# Patient Record
Sex: Male | Born: 1955
Health system: Southern US, Community
[De-identification: ages and names within clinical notes are randomized; demographics above are authoritative.]

## PROBLEM LIST (undated history)

## (undated) DIAGNOSIS — M109 Gout, unspecified: Secondary | ICD-10-CM

## (undated) DIAGNOSIS — Z8619 Personal history of other infectious and parasitic diseases: Secondary | ICD-10-CM

## (undated) HISTORY — DX: Personal history of other infectious and parasitic diseases: Z86.19

## (undated) HISTORY — DX: Gout, unspecified: M10.9

---
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
## 2009-01-01 ENCOUNTER — Ambulatory Visit: Payer: Self-pay | Admitting: Gastroenterology

## 2009-01-01 DIAGNOSIS — Z860101 Personal history of adenomatous and serrated colon polyps: Secondary | ICD-10-CM | POA: Insufficient documentation

## 2009-01-01 DIAGNOSIS — Z86010 Personal history of colonic polyps: Secondary | ICD-10-CM | POA: Insufficient documentation

## 2009-01-01 LAB — HM COLONOSCOPY

## 2009-02-04 DIAGNOSIS — M109 Gout, unspecified: Secondary | ICD-10-CM | POA: Insufficient documentation

## 2009-02-24 DIAGNOSIS — I10 Essential (primary) hypertension: Secondary | ICD-10-CM | POA: Insufficient documentation

## 2009-06-03 ENCOUNTER — Ambulatory Visit: Payer: Self-pay | Admitting: Family Medicine

## 2009-06-03 DIAGNOSIS — G4700 Insomnia, unspecified: Secondary | ICD-10-CM | POA: Insufficient documentation

## 2009-06-05 ENCOUNTER — Ambulatory Visit: Payer: Self-pay | Admitting: Family Medicine

## 2009-07-20 ENCOUNTER — Ambulatory Visit: Payer: Self-pay | Admitting: Gastroenterology

## 2010-04-26 ENCOUNTER — Ambulatory Visit: Payer: Self-pay | Admitting: Family Medicine

## 2010-05-03 ENCOUNTER — Ambulatory Visit: Payer: Self-pay | Admitting: Gastroenterology

## 2010-05-03 LAB — HM SIGMOIDOSCOPY

## 2011-01-24 LAB — LIPID PANEL
Cholesterol: 169 mg/dL (ref 0–200)
HDL: 37 mg/dL (ref 35–70)
Triglycerides: 630 mg/dL — AB (ref 40–160)

## 2011-03-15 ENCOUNTER — Emergency Department: Payer: Self-pay | Admitting: Emergency Medicine

## 2011-08-21 IMAGING — CR DG CHEST 2V
1 series · 2 of 2 positions shown · non-contrast
Comparison: none

REASON FOR EXAM: shortness of breath
COMMENTS:

[Series 1: view not recorded · 0.17mm/px · 2 of 2 slices shown]
[im 1/2]
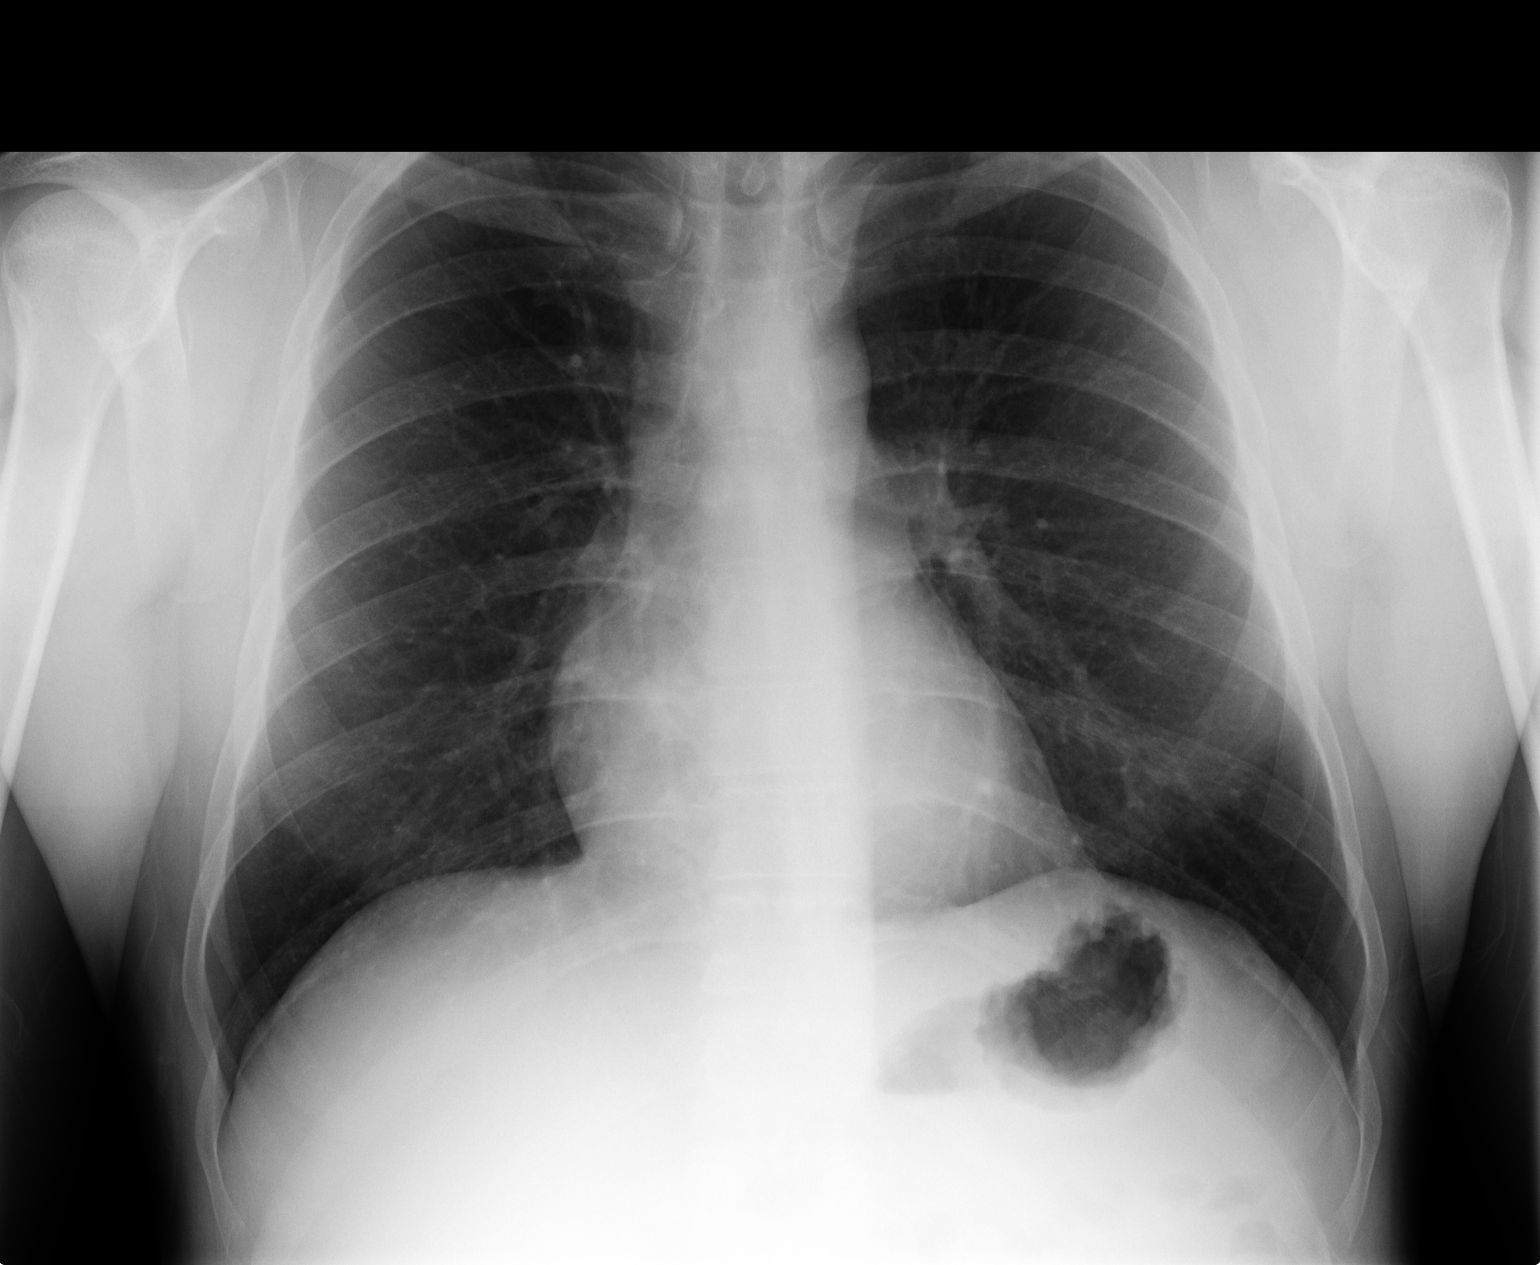
[im 2/2]
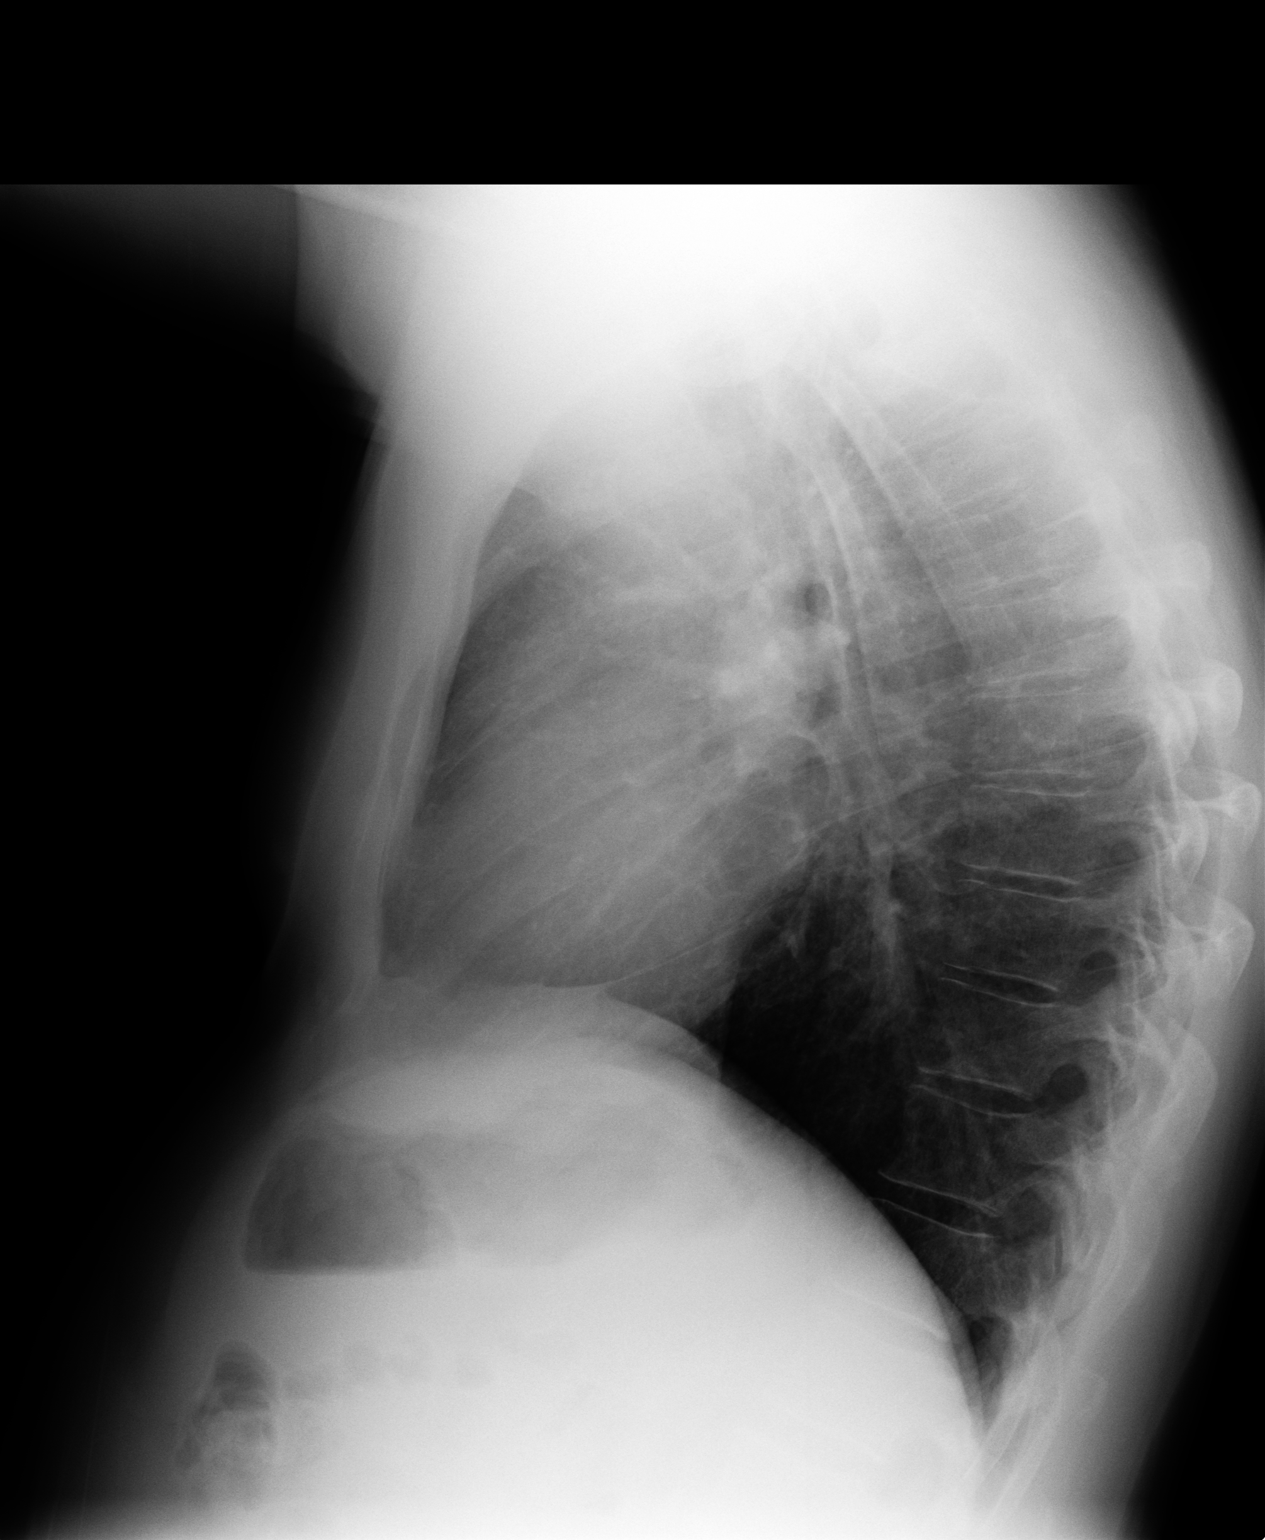

[2 of 2 positions shown; findings below may reference images not displayed]

PROCEDURE:     KDR - KDXR CHEST PA (OR AP) AND LAT  - June 03, 2009  [DATE]

RESULT:     There is no previous exam for comparison.

The lungs are clear. The heart and pulmonary vessels are normal. The bony
and mediastinal structures are unremarkable. There is no effusion. There is
no pneumothorax or evidence of congestive failure.
IMPRESSION: No acute cardiopulmonary disease.

## 2011-08-23 IMAGING — US ABDOMEN ULTRASOUND
1 series · 17 of 25 positions shown · non-contrast
Comparison: none

REASON FOR EXAM: abd pain generalized
COMMENTS:

[Series 1: abdomen ultrasound · 17 of 76 slices shown]
[im 1/76]
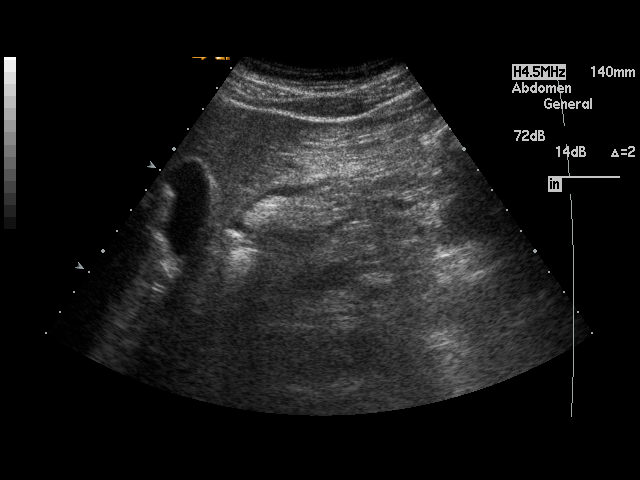
[im 7/76]
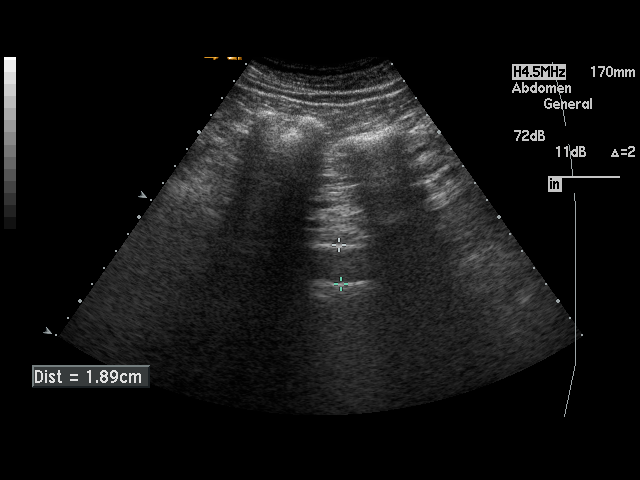
[im 10/76]
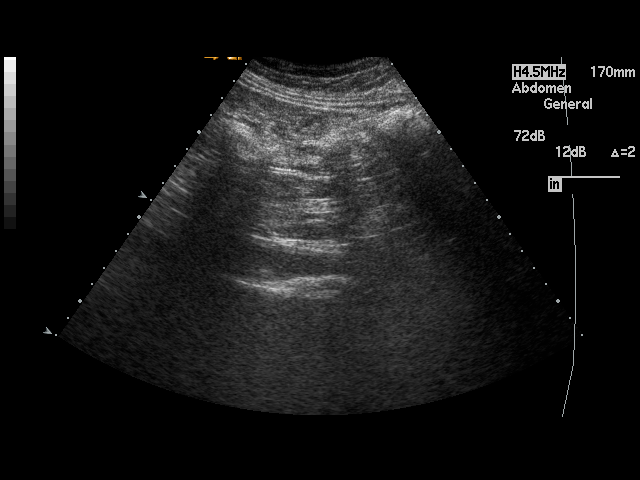
[im 16/76]
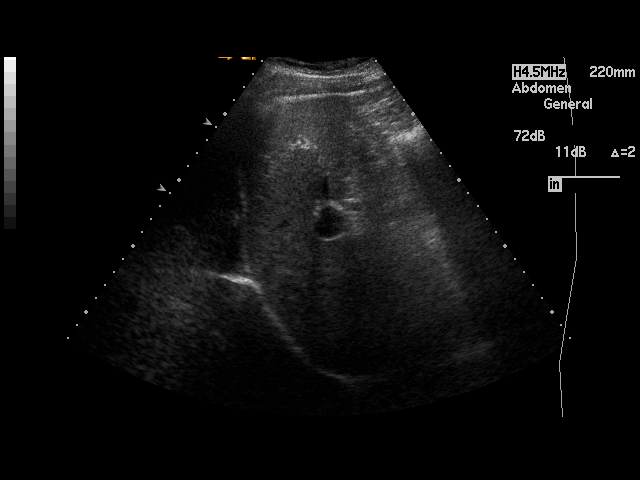
[im 19/76]
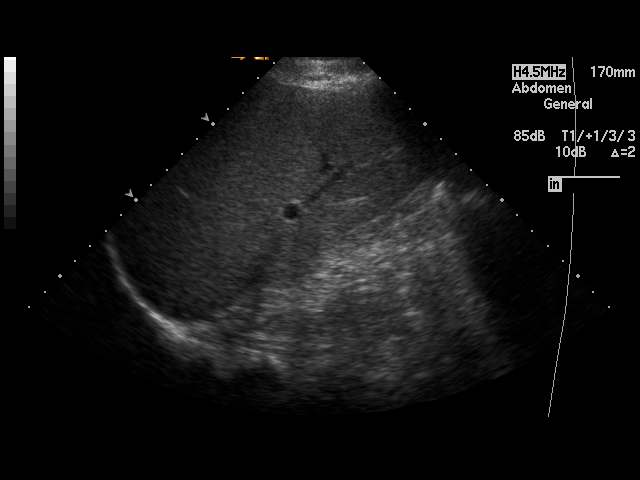
[im 26/76]
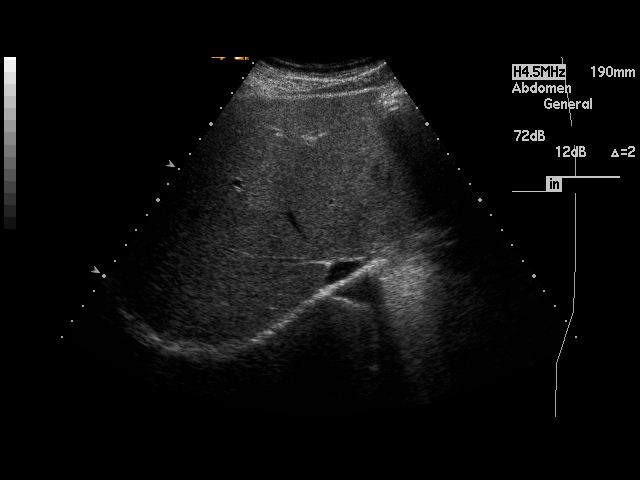
[im 29/76]
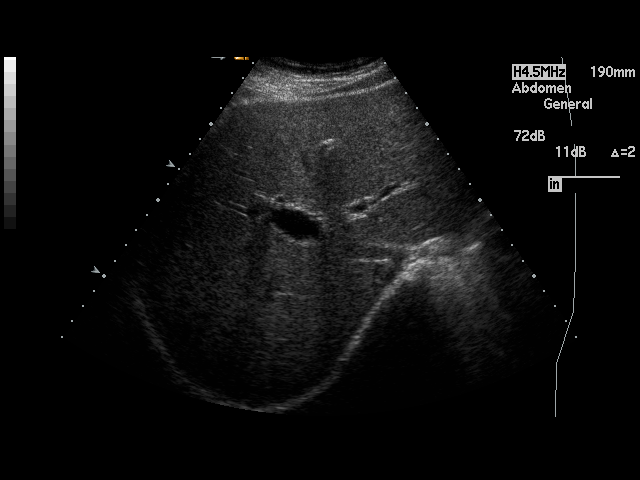
[im 35/76]
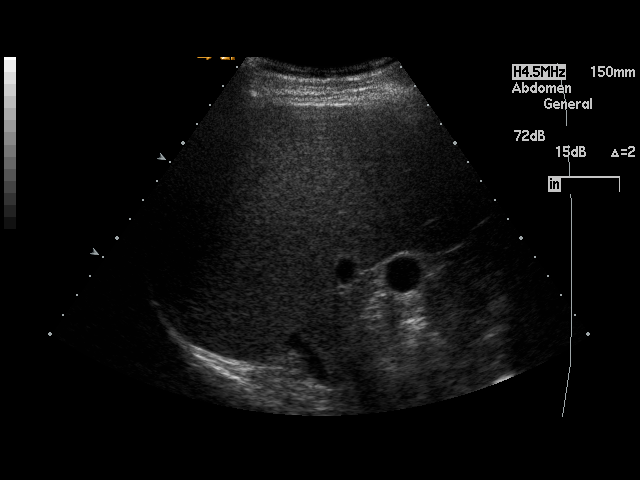
[im 38/76]
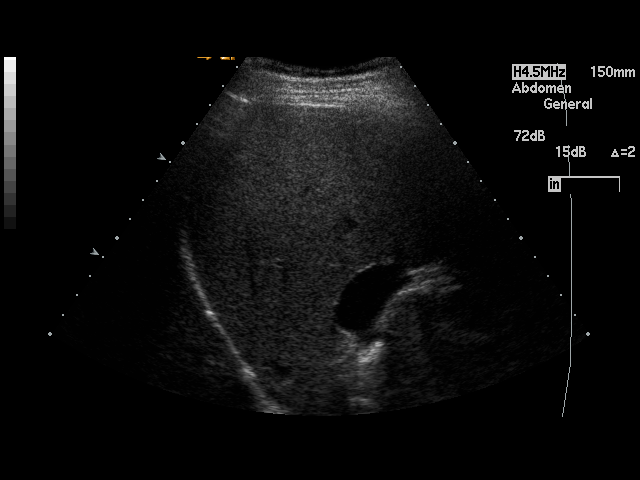
[im 41/76]
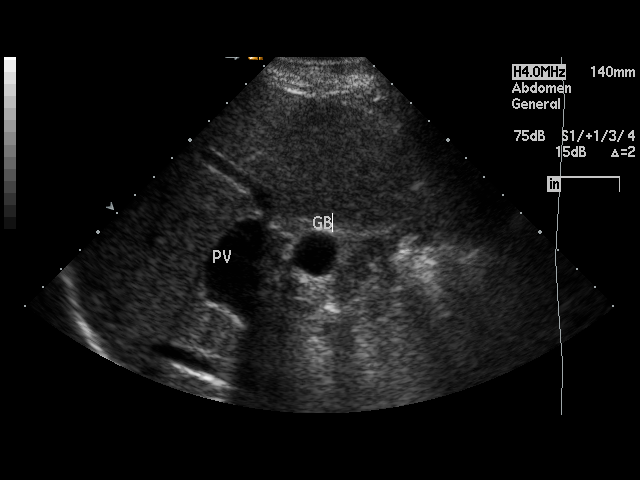
[im 47/76]
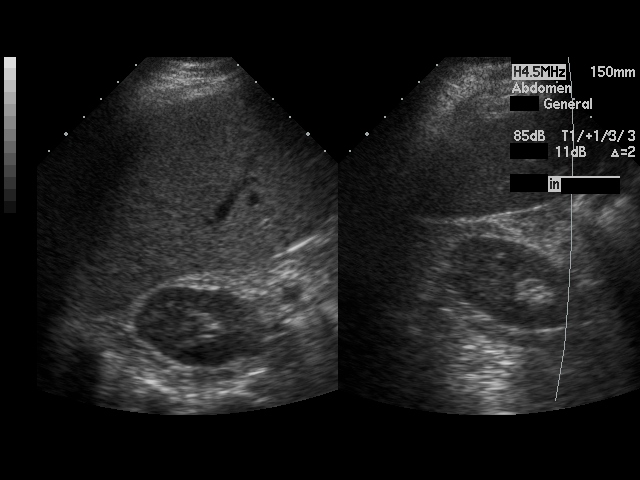
[im 51/76]
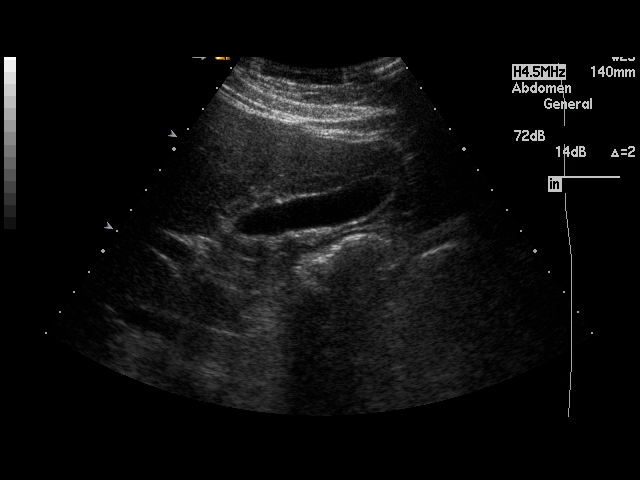
[im 57/76]
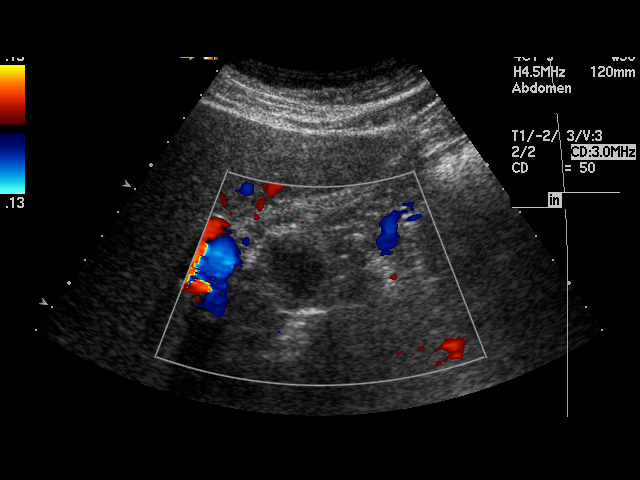
[im 60/76]
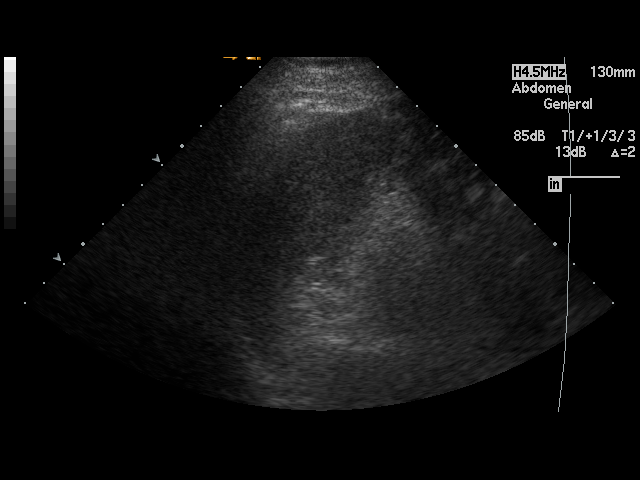
[im 66/76]
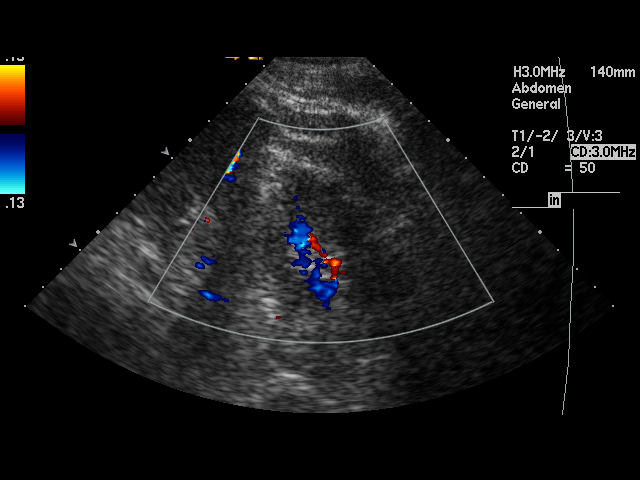
[im 69/76]
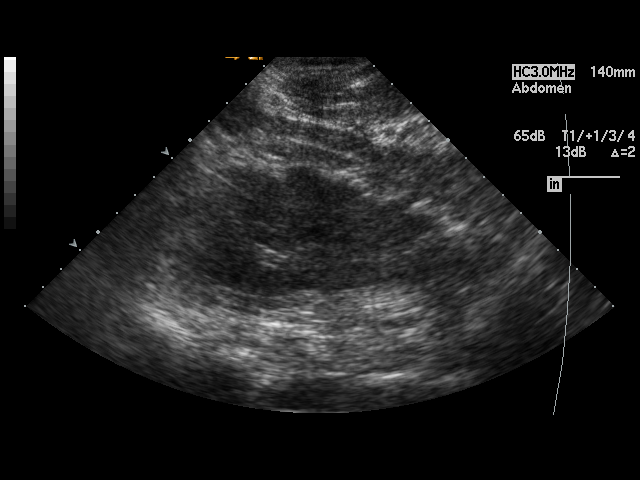
[im 76/76]
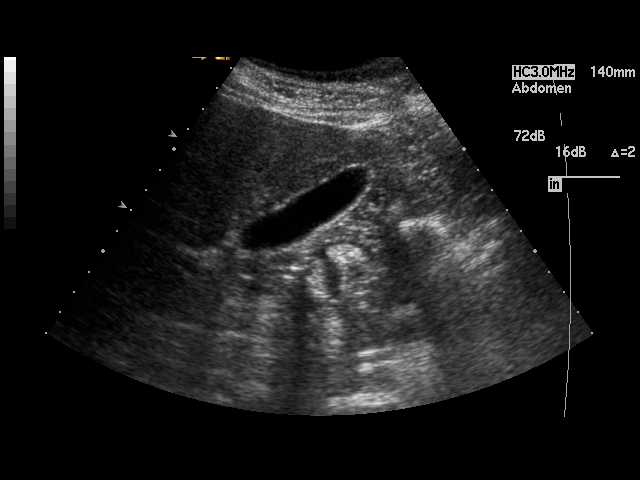

[17 of 25 positions shown; findings below may reference images not displayed]

PROCEDURE:     US  - US ABDOMEN GENERAL SURVEY  - June 05, 2009 [DATE]

RESULT:     Sonographic interrogation of the abdomen is performed. The
pancreas is not well demonstrated because of overlying bowel gas. The
visualized aorta shows no aneurysm. The liver shows a normal appearing
echotexture with no evidence of a mass or ductal dilation. The portal venous
flow appears normal. The gallbladder is normal with no wall thickening. The
wall measures 1.9 mm. The common bile duct measures 3.5 mm in diameter. The
kidneys demonstrate no mass or obstruction. No stones are evident.
IMPRESSION: 1.     Poor visualization of the pancreas. Otherwise, unremarkable abdominal
sonogram.

## 2012-07-13 IMAGING — CR DG CHEST 2V
1 series · 2 of 2 positions shown · non-contrast
Comparison: none

REASON FOR EXAM: shortness of breath
COMMENTS:

PROCEDURE:     KDR - KDXR CHEST PA (OR AP) AND LAT  - April 26, 2010 [DATE]
RESULT:     The lungs are clear. The cardiac silhouette and visualized bony
skeleton are unremarkable.

[Series 1: view not recorded · 0.17mm/px · 2 of 2 slices shown]
[im 1/2]
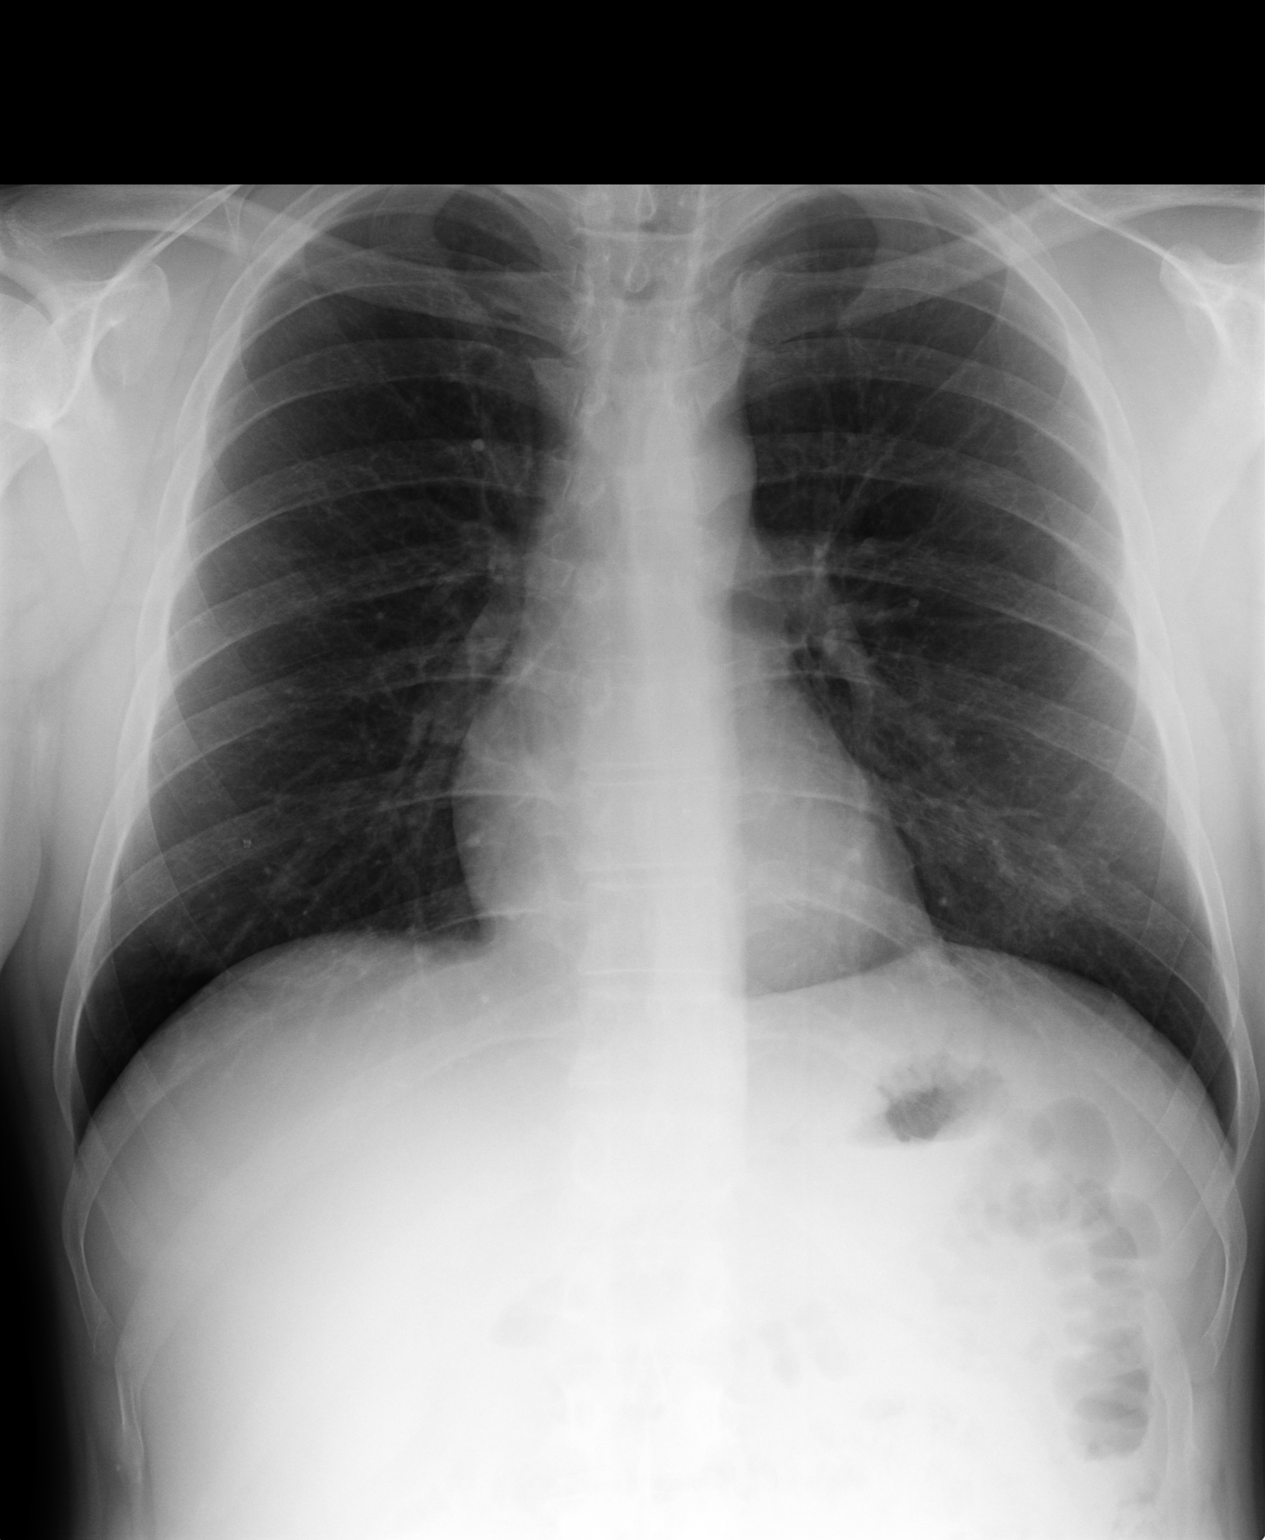
[im 2/2]
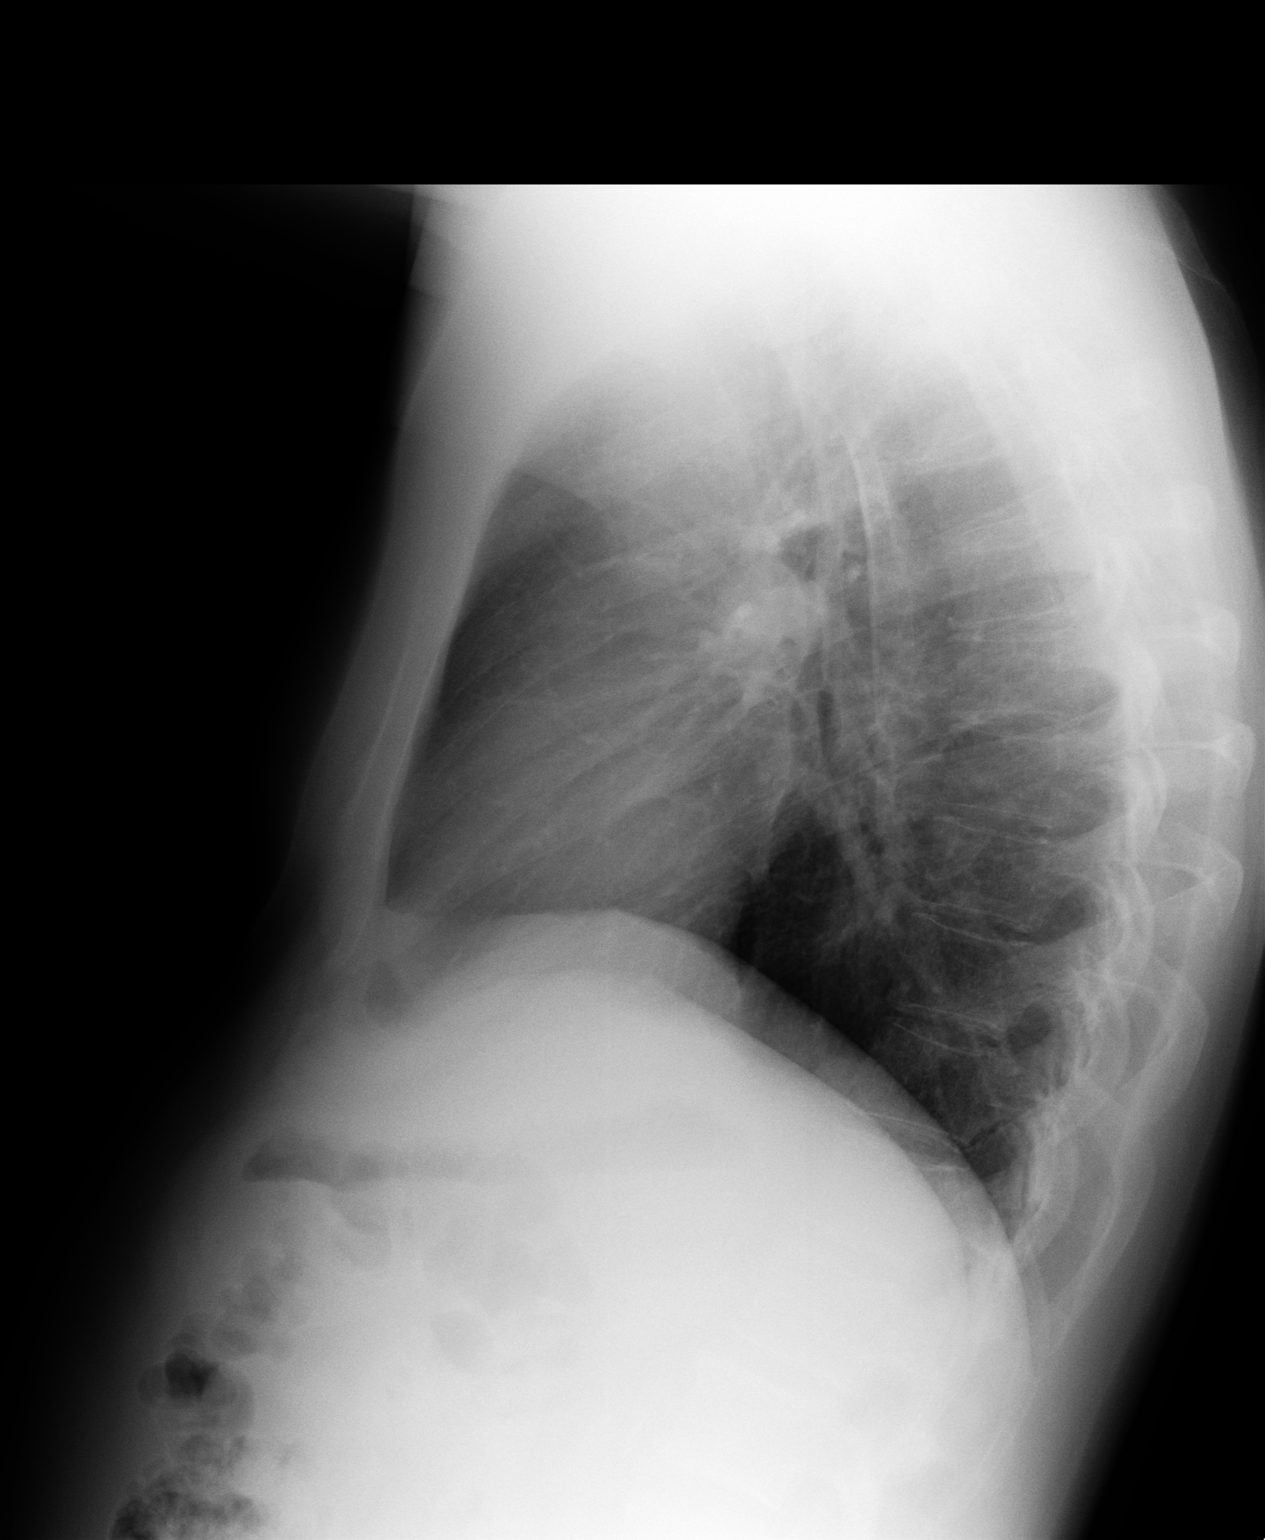

[2 of 2 positions shown; findings below may reference images not displayed]

IMPRESSION: 1. Chest radiograph without evidence of acute cardiopulmonary disease.
2. A comparison was made to prior study dated 06/03/2009.

## 2012-12-28 LAB — BASIC METABOLIC PANEL
BUN: 10 mg/dL (ref 4–21)
CREATININE: 1 mg/dL (ref 0.6–1.3)
GLUCOSE: 106 mg/dL
Potassium: 4 mmol/L (ref 3.4–5.3)
SODIUM: 139 mmol/L (ref 137–147)

## 2012-12-28 LAB — CBC AND DIFFERENTIAL
HCT: 40 % — AB (ref 41–53)
Hemoglobin: 13.6 g/dL (ref 13.5–17.5)
PLATELETS: 198 10*3/uL (ref 150–399)
WBC: 5.5 10^3/mL

## 2012-12-28 LAB — HEPATIC FUNCTION PANEL
ALT: 33 U/L (ref 10–40)
AST: 22 U/L (ref 14–40)

## 2015-05-11 ENCOUNTER — Other Ambulatory Visit: Payer: Self-pay | Admitting: Family Medicine

## 2015-07-01 ENCOUNTER — Other Ambulatory Visit: Payer: Self-pay | Admitting: Family Medicine

## 2015-07-02 ENCOUNTER — Other Ambulatory Visit: Payer: Self-pay | Admitting: Family Medicine

## 2015-07-23 ENCOUNTER — Other Ambulatory Visit: Payer: Self-pay | Admitting: Family Medicine

## 2015-08-05 ENCOUNTER — Other Ambulatory Visit: Payer: Self-pay | Admitting: Family Medicine

## 2015-08-06 ENCOUNTER — Other Ambulatory Visit: Payer: Self-pay | Admitting: Family Medicine

## 2015-09-01 ENCOUNTER — Other Ambulatory Visit: Payer: Self-pay | Admitting: Family Medicine

## 2015-10-15 ENCOUNTER — Other Ambulatory Visit: Payer: Self-pay

## 2015-10-15 NOTE — Telephone Encounter (Signed)
Pharmacy sent refill request and patient called saying he only has 4 pills left. Patient scheduled an appt for 10/23/15 for F/U. Can patient get enough to last until appt?

## 2015-10-16 MED ORDER — AMLODIPINE BESYLATE 10 MG PO TABS: 10.0000 mg | ORAL_TABLET | Freq: Every day | ORAL | Status: DC

## 2015-10-22 DIAGNOSIS — E781 Pure hyperglyceridemia: Secondary | ICD-10-CM | POA: Insufficient documentation

## 2015-10-22 DIAGNOSIS — F17210 Nicotine dependence, cigarettes, uncomplicated: Secondary | ICD-10-CM | POA: Insufficient documentation

## 2015-10-22 DIAGNOSIS — R0609 Other forms of dyspnea: Secondary | ICD-10-CM

## 2015-10-22 DIAGNOSIS — E790 Hyperuricemia without signs of inflammatory arthritis and tophaceous disease: Secondary | ICD-10-CM | POA: Insufficient documentation

## 2015-10-22 DIAGNOSIS — K219 Gastro-esophageal reflux disease without esophagitis: Secondary | ICD-10-CM | POA: Insufficient documentation

## 2015-10-22 DIAGNOSIS — R0600 Dyspnea, unspecified: Secondary | ICD-10-CM | POA: Insufficient documentation

## 2015-10-22 DIAGNOSIS — N529 Male erectile dysfunction, unspecified: Secondary | ICD-10-CM | POA: Insufficient documentation

## 2015-10-23 ENCOUNTER — Encounter: Payer: Self-pay | Admitting: Family Medicine

## 2015-10-23 ENCOUNTER — Ambulatory Visit (INDEPENDENT_AMBULATORY_CARE_PROVIDER_SITE_OTHER): Payer: BLUE CROSS/BLUE SHIELD | Admitting: Family Medicine

## 2015-10-23 VITALS — BP 148/62 | HR 76 | Temp 98.1°F | Resp 18 | Ht 67.0 in | Wt 159.0 lb

## 2015-10-23 DIAGNOSIS — I10 Essential (primary) hypertension: Secondary | ICD-10-CM | POA: Diagnosis not present

## 2015-10-23 DIAGNOSIS — M109 Gout, unspecified: Secondary | ICD-10-CM | POA: Diagnosis not present

## 2015-10-23 DIAGNOSIS — Z720 Tobacco use: Secondary | ICD-10-CM | POA: Diagnosis not present

## 2015-10-23 DIAGNOSIS — K219 Gastro-esophageal reflux disease without esophagitis: Secondary | ICD-10-CM

## 2015-10-23 DIAGNOSIS — N529 Male erectile dysfunction, unspecified: Secondary | ICD-10-CM

## 2015-10-23 DIAGNOSIS — E781 Pure hyperglyceridemia: Secondary | ICD-10-CM | POA: Diagnosis not present

## 2015-10-23 DIAGNOSIS — N528 Other male erectile dysfunction: Secondary | ICD-10-CM | POA: Diagnosis not present

## 2015-10-23 MED ORDER — OMEPRAZOLE 20 MG PO CPDR
20.0000 mg | DELAYED_RELEASE_CAPSULE | Freq: Every day | ORAL | Status: DC
Start: 2015-10-23 — End: 2016-04-05

## 2015-10-23 MED ORDER — SILDENAFIL CITRATE 50 MG PO TABS: ORAL_TABLET | ORAL | Status: DC

## 2015-10-23 NOTE — Patient Instructions (Addendum)
Please contact your eyecare professional to schedule a routine eye exam. I recommend the [facility] at [phone]   When you are ready to stop smoking, call my office for prescription for bupropion (Zyban) to make it easier to quit.    Smoking Cessation, Tips for Success If you are ready to quit smoking, congratulations! You have chosen to help yourself be healthier. Cigarettes bring nicotine, tar, carbon monoxide, and other irritants into your body. Your lungs, heart, and blood vessels will be able to work better without these poisons. There are many different ways to quit smoking. Nicotine gum, nicotine patches, a nicotine inhaler, or nicotine nasal spray can help with physical craving. Hypnosis, support groups, and medicines help break the habit of smoking. WHAT THINGS CAN I DO TO MAKE QUITTING EASIER?  Here are some tips to help you quit for good:  Pick a date when you will quit smoking completely. Tell all of your friends and family about your plan to quit on that date.  Do not try to slowly cut down on the number of cigarettes you are smoking. Pick a quit date and quit smoking completely starting on that day.  Throw away all cigarettes.   Clean and remove all ashtrays from your home, work, and car.  On a card, write down your reasons for quitting. Carry the card with you and read it when you get the urge to smoke.  Cleanse your body of nicotine. Drink enough water and fluids to keep your urine clear or pale yellow. Do this after quitting to flush the nicotine from your body.  Learn to predict your moods. Do not let a bad situation be your excuse to have a cigarette. Some situations in your life might tempt you into wanting a cigarette.  Never have "just one" cigarette. It leads to wanting another and another. Remind yourself of your decision to quit.  Change habits associated with smoking. If you smoked while driving or when feeling stressed, try other activities to  replace smoking. Stand up when drinking your coffee. Brush your teeth after eating. Sit in a different chair when you read the paper. Avoid alcohol while trying to quit, and try to drink fewer caffeinated beverages. Alcohol and caffeine may urge you to smoke.  Avoid foods and drinks that can trigger a desire to smoke, such as sugary or spicy foods and alcohol.  Ask people who smoke not to smoke around you.  Have something planned to do right after eating or having a cup of coffee. For example, plan to take a walk or exercise.  Try a relaxation exercise to calm you down and decrease your stress. Remember, you may be tense and nervous for the first 2 weeks after you quit, but this will pass.  Find new activities to keep your hands busy. Play with a pen, coin, or rubber band. Doodle or draw things on paper.  Brush your teeth right after eating. This will help cut down on the craving for the taste of tobacco after meals. You can also try mouthwash.   Use oral substitutes in place of cigarettes. Try using lemon drops, carrots, cinnamon sticks, or chewing gum. Keep them handy so they are available when you have the urge to smoke.  When you have the urge to smoke, try deep breathing.  Designate your home as a nonsmoking area.  If you are a heavy smoker, ask your health care provider about a prescription for nicotine chewing gum. It can  ease your withdrawal from nicotine.  Reward yourself. Set aside the cigarette money you save and buy yourself something nice.  Look for support from others. Join a support group or smoking cessation program. Ask someone at home or at work to help you with your plan to quit smoking.  Always ask yourself, "Do I need this cigarette or is this just a reflex?" Tell yourself, "Today, I choose not to smoke," or "I do not want to smoke." You are reminding yourself of your decision to quit.  Do not replace cigarette smoking with electronic cigarettes (commonly called  e-cigarettes). The safety of e-cigarettes is unknown, and some may contain harmful chemicals.  If you relapse, do not give up! Plan ahead and think about what you will do the next time you get the urge to smoke. HOW WILL I FEEL WHEN I QUIT SMOKING? You may have symptoms of withdrawal because your body is used to nicotine (the addictive substance in cigarettes). You may crave cigarettes, be irritable, feel very hungry, cough often, get headaches, or have difficulty concentrating. The withdrawal symptoms are only temporary. They are strongest when you first quit but will go away within 10-14 days. When withdrawal symptoms occur, stay in control. Think about your reasons for quitting. Remind yourself that these are signs that your body is healing and getting used to being without cigarettes. Remember that withdrawal symptoms are easier to treat than the major diseases that smoking can cause.  Even after the withdrawal is over, expect periodic urges to smoke. However, these cravings are generally short lived and will go away whether you smoke or not. Do not smoke! WHAT RESOURCES ARE AVAILABLE TO HELP ME QUIT SMOKING? Your health care provider can direct you to community resources or hospitals for support, which may include:  Group support.  Education.  Hypnosis.  Therapy.   This information is not intended to replace advice given to you by your health care provider. Make sure you discuss any questions you have with your health care provider.   Document Released: 06/03/2004 Document Revised: 09/26/2014 Document Reviewed: 02/21/2013 Elsevier Interactive Patient Education [employer].

## 2015-10-23 NOTE — Progress Notes (Signed)
Patient: Omar Watson Male    DOB: 1956/06/03   60 y.o.   MRN: [id number] Visit Date: 10/23/2015  Today's Provider: Lelon Huh, MD   Chief Complaint  Patient presents with  . Hypertension    follow up  . Gastroesophageal Reflux    follow up  . Hyperlipidemia    follow up  . Gout    follow up  . Erectile Dysfunction    follow up   Subjective:    HPI  Hypertension, follow-up:  BP Readings from Last 3 Encounters:  06/27/14 140/80    He was last seen for hypertension 16  months ago.  BP at that visit was 140/80. Management since that visit includes no changes. He reports fair compliance with treatment. Has been out of his medication for 2 days. Patient states the refill is at the pharmacy, he just hasn't picked it up yet.  He is not having side effects.  He is not exercising. He is not adherent to low salt diet.   Outside blood pressures are not being checked. He is experiencing none.  Patient denies chest pain, chest pressure/discomfort, claudication, dyspnea, exertional chest pressure/discomfort, fatigue, irregular heart beat, lower extremity edema and near-syncope.   Cardiovascular risk factors include advanced age (older than 78 for men, 1 for women), hypertension, male gender and smoking/ tobacco exposure.  Use of agents associated with hypertension: none.     Weight trend: decreasing steadily Wt Readings from Last 3 Encounters:  06/27/14 164 lb (74.39 kg)    Current diet: in general, an "unhealthy" diet  ------------------------------------------------------------------------   Lipid/Cholesterol, Follow-up:   Last seen for this16  months ago.  Management changes since that visit include ordering labs (patient never completed lab work). . Last Lipid Panel:    Component Value Date/Time   CHOL 169 01/24/2011   TRIG 630* 01/24/2011   HDL 37 01/24/2011    Risk factors for vascular disease include hypertension and smoking  He reports poor  compliance with treatment. He is not having side effects.  Current symptoms include none and have been stable. Weight trend: decreasing steadily Prior visit with dietician: no Current diet: in general, an "unhealthy" diet Current exercise: none  Wt Readings from Last 3 Encounters:  06/27/14 164 lb (74.39 kg)    ------------------------------------------------------------------- Follow up GERD:  Last office visit was 06/27/2014 and no changes were made. Patient states he ran out of the prescription Omeprazole and has been having to take OTC. Patient states the Omeprazole helps control his reflux.    Follow up Gout:  Last office visit was 06/27/2014. Management during that visit includes ordering labs. Patient did not complete lab work at that time. Patient is currently taking Allopurinol 100mg  daily. Patient states he has not had any Gout flares.    Follow up ED:  Last office visit was 06/27/2014. Changes made include starting Viagra. Patient states he needs a refill today.     Allergies  Allergen Reactions  . Penicillins    Previous Medications   ALLOPURINOL (ZYLOPRIM) 100 MG TABLET    TAKE 1 TABLET BY MOUTH EVERY DAY   AMLODIPINE (NORVASC) 10 MG TABLET    Take 1 tablet (10 mg total) by mouth daily.   OMEPRAZOLE 20 MG TBEC    Take 1 tablet by mouth daily.   VIAGRA 50 MG TABLET    TAKE 1 TO 2 TABLETS BY MOUTH 30 MINUTES BEFORE INTERCOURSE-NO MORE THAN 2 IN 24 HOURS  Review of Systems  Constitutional: Negative for fever, chills and appetite change.  Respiratory: Negative for chest tightness, shortness of breath and wheezing.   Cardiovascular: Negative for chest pain and palpitations.  Gastrointestinal: Negative for nausea, vomiting and abdominal pain.    Social History  Substance Use Topics  . Smoking status: Current Every Day Smoker -- 1.00 packs/day for 25 years    Types: Cigarettes  . Smokeless tobacco: Not on file  . Alcohol Use: 0.0 oz/week    0 Standard drinks  or equivalent per week     Comment: drinks 6 beers daily    Objective:   BP 148/62 mmHg  Pulse 76  Temp(Src) 98.1 F (36.7 C) (Oral)  Resp 18  Ht 5\' 7"  (1.702 m)  Wt 159 lb (72.122 kg)  BMI 24.90 kg/m2  SpO2 100%  Physical Exam   General Appearance:    Alert, cooperative, no distress  Eyes:    PERRL, conjunctiva/corneas clear, EOM's intact       Lungs:     Clear to auscultation bilaterally, respirations unlabored  Heart:    Regular rate and rhythm  Neurologic:   Awake, alert, oriented x 3. No apparent focal neurological           defect.           Assessment & Plan:     1. Essential (primary) hypertension Up today since being out of medications, but usually well controlled.  - EKG 12-Lead - Renal function panel  2. Gastroesophageal reflux disease without esophagitis Well controlled with omeprazole.   3. Hypertriglyceridemia  - Lipid panel - Hepatic function panel  4. Tobacco abuse Extensively counseled on importance of quitting smoking, and medications available to help stop smoking.   5. Gout without tophus, unspecified cause, unspecified chronicity, unspecified site Well controlled.   Continue allopurinol.        Lelon Huh, MD  [facility]

## 2015-10-24 LAB — LIPID PANEL
CHOL/HDL RATIO: 3.8 ratio (ref 0.0–5.0)
CHOLESTEROL TOTAL: 216 mg/dL — AB (ref 100–199)
HDL: 57 mg/dL (ref >39)
LDL CALC: 96 mg/dL (ref 0–99)
Triglycerides: 316 mg/dL — ABNORMAL HIGH (ref 0–149)
VLDL CHOLESTEROL CAL: 63 mg/dL — AB (ref 5–40)

## 2015-10-24 LAB — HEPATIC FUNCTION PANEL
ALT: 29 IU/L (ref 0–44)
AST: 19 IU/L (ref 0–40)
Alkaline Phosphatase: 88 IU/L (ref 39–117)
BILIRUBIN TOTAL: 0.6 mg/dL (ref 0.0–1.2)
BILIRUBIN, DIRECT: 0.19 mg/dL (ref 0.00–0.40)
Total Protein: 7.8 g/dL (ref 6.0–8.5)

## 2015-10-24 LAB — RENAL FUNCTION PANEL
Albumin: 5 g/dL (ref 3.5–5.5)
BUN / CREAT RATIO: 8 — AB (ref 9–20)
BUN: 9 mg/dL (ref 6–24)
CO2: 23 mmol/L (ref 18–29)
CREATININE: 1.06 mg/dL (ref 0.76–1.27)
Calcium: 10.3 mg/dL — ABNORMAL HIGH (ref 8.7–10.2)
Chloride: 95 mmol/L — ABNORMAL LOW (ref 96–106)
GFR calc Af Amer: 90 mL/min/{1.73_m2} (ref >59)
GFR, EST NON AFRICAN AMERICAN: 78 mL/min/{1.73_m2} (ref >59)
Glucose: 122 mg/dL — ABNORMAL HIGH (ref 65–99)
Phosphorus: 3.5 mg/dL (ref 2.5–4.5)
Potassium: 4.1 mmol/L (ref 3.5–5.2)
SODIUM: 135 mmol/L (ref 134–144)

## 2016-02-26 ENCOUNTER — Other Ambulatory Visit: Payer: Self-pay | Admitting: Family Medicine

## 2016-02-26 DIAGNOSIS — I10 Essential (primary) hypertension: Secondary | ICD-10-CM

## 2016-03-09 ENCOUNTER — Telehealth: Payer: Self-pay | Admitting: *Deleted

## 2016-03-09 ENCOUNTER — Encounter: Payer: Self-pay | Admitting: Family Medicine

## 2016-03-09 ENCOUNTER — Ambulatory Visit (INDEPENDENT_AMBULATORY_CARE_PROVIDER_SITE_OTHER): Payer: BLUE CROSS/BLUE SHIELD | Admitting: Family Medicine

## 2016-03-09 VITALS — BP 130/64 | HR 78 | Temp 98.1°F | Resp 16 | Wt 161.0 lb

## 2016-03-09 DIAGNOSIS — B029 Zoster without complications: Secondary | ICD-10-CM

## 2016-03-09 MED ORDER — VALACYCLOVIR HCL 1 G PO TABS: 1000.0000 mg | ORAL_TABLET | Freq: Three times a day (TID) | ORAL | Status: AC

## 2016-03-09 MED ORDER — VALACYCLOVIR HCL 1 G PO TABS: 1000.0000 mg | ORAL_TABLET | Freq: Three times a day (TID) | ORAL | Status: DC

## 2016-03-09 NOTE — Telephone Encounter (Signed)
Patient stated that he had a rash that came up yesterday on right buttock. Rash is red, painful, burning and itching. Patient was given appt for today at 3:00pm.

## 2016-03-09 NOTE — Progress Notes (Signed)
       Patient: Omar Watson Male    DOB: 1956/03/05   61 y.o.   MRN: [id number] Visit Date: 03/09/2016  Today's Provider: Lelon Huh, MD   Chief Complaint  Patient presents with  . Rash    x 2 days   Subjective:    Rash This is a new problem. Episode onset: 2 days ago. The problem has been gradually worsening since onset. The affected locations include the right buttock. The rash is characterized by redness. He was exposed to nothing. Pertinent negatives include no anorexia, congestion, cough, diarrhea, facial edema, fatigue, fever, joint pain, rhinorrhea, shortness of breath, sore throat or vomiting. Past treatments include nothing.  Patient states the rash hurts when it rubs up against his pants when sitting.      Allergies  Allergen Reactions  . Penicillins    Current Meds  Medication Sig  . allopurinol (ZYLOPRIM) 100 MG tablet TAKE 1 TABLET BY MOUTH EVERY DAY  . amLODipine (NORVASC) 10 MG tablet TAKE 1 TABLET BY MOUTH EVERY DAY  . omeprazole (PRILOSEC) 20 MG capsule Take 1 capsule (20 mg total) by mouth daily.  . sildenafil (VIAGRA) 50 MG tablet TAKE 1 TO 2 TABLETS BY MOUTH 30 MINUTES BEFORE INTERCOURSE-NO MORE THAN 2 IN 24 HOURS    Review of Systems  Constitutional: Negative for fever, chills, appetite change and fatigue.  HENT: Negative for congestion, rhinorrhea and sore throat.   Respiratory: Negative for cough, chest tightness, shortness of breath and wheezing.   Cardiovascular: Negative for chest pain and palpitations.  Gastrointestinal: Negative for nausea, vomiting, abdominal pain, diarrhea and anorexia.  Musculoskeletal: Negative for joint pain.  Skin: Positive for rash.    Social History  Substance Use Topics  . Smoking status: Current Every Day Smoker -- 1.00 packs/day for 25 years    Types: Cigarettes  . Smokeless tobacco: Not on file  . Alcohol Use: 0.0 oz/week    0 Standard drinks or equivalent per week     Comment: drinks 6 beers daily     Objective:   BP 130/64 mmHg  Pulse 78  Temp(Src) 98.1 F (36.7 C) (Oral)  Resp 16  Wt 161 lb (73.029 kg)  SpO2 99%  Physical Exam  Skin: About 4cm x 3cm oval patch of vesicles on erythematous base on right lateral buttocks.     Assessment & Plan:     1. Shingles  - valACYclovir (VALTREX) 1000 MG tablet; Take 1 tablet (1,000 mg total) by mouth 3 (three) times daily.  Dispense: 21 tablet; Refill: 0  The entirety of the information documented in the History of Present Illness, Review of Systems and Physical Exam were personally obtained by me. Portions of this information were initially documented by Meyer Cory, CMA and reviewed by me for thoroughness and accuracy.        Lelon Huh, MD  [place]

## 2016-03-09 NOTE — Patient Instructions (Signed)

## 2016-04-05 ENCOUNTER — Other Ambulatory Visit: Payer: Self-pay | Admitting: Family Medicine

## 2016-09-25 ENCOUNTER — Other Ambulatory Visit: Payer: Self-pay | Admitting: Family Medicine

## 2016-09-25 DIAGNOSIS — I10 Essential (primary) hypertension: Secondary | ICD-10-CM

## 2017-01-13 ENCOUNTER — Other Ambulatory Visit: Payer: Self-pay | Admitting: Family Medicine

## 2017-01-13 DIAGNOSIS — N529 Male erectile dysfunction, unspecified: Secondary | ICD-10-CM

## 2017-01-25 ENCOUNTER — Other Ambulatory Visit: Payer: Self-pay | Admitting: Family Medicine

## 2017-01-25 MED ORDER — SILDENAFIL CITRATE 20 MG PO TABS: ORAL_TABLET | ORAL | 0 refills | Status: DC

## 2017-04-18 ENCOUNTER — Other Ambulatory Visit: Payer: Self-pay | Admitting: Family Medicine

## 2017-04-18 DIAGNOSIS — N529 Male erectile dysfunction, unspecified: Secondary | ICD-10-CM

## 2017-04-25 NOTE — Telephone Encounter (Signed)
Last ov 03/09/16 Last filled 01/25/17 Please review. Thank you. sd

## 2017-04-27 ENCOUNTER — Other Ambulatory Visit: Payer: Self-pay | Admitting: Family Medicine

## 2017-04-27 NOTE — Telephone Encounter (Signed)
[facility] faxed a request on the following medications. Thanks CC  sildenafil (REVATIO) 20 MG tablet  Take 3-5 tablet as needed prior to intercourse, not to exceed 5 tablets in a day.

## 2017-05-03 ENCOUNTER — Other Ambulatory Visit: Payer: Self-pay | Admitting: Family Medicine

## 2017-05-15 ENCOUNTER — Other Ambulatory Visit: Payer: Self-pay | Admitting: Family Medicine

## 2017-06-10 ENCOUNTER — Other Ambulatory Visit: Payer: Self-pay | Admitting: Family Medicine

## 2017-06-10 DIAGNOSIS — I10 Essential (primary) hypertension: Secondary | ICD-10-CM

## 2017-06-11 ENCOUNTER — Other Ambulatory Visit: Payer: Self-pay | Admitting: Family Medicine

## 2017-06-18 ENCOUNTER — Other Ambulatory Visit: Payer: Self-pay | Admitting: Family Medicine

## 2017-08-09 ENCOUNTER — Other Ambulatory Visit: Payer: Self-pay | Admitting: Family Medicine

## 2017-08-09 DIAGNOSIS — I10 Essential (primary) hypertension: Secondary | ICD-10-CM

## 2017-08-09 NOTE — Telephone Encounter (Signed)
Patient has not been seen in a year and a half. Needs to schedule o.v. Before refill can be approved.

## 2017-08-09 NOTE — Telephone Encounter (Signed)
Patient advised. Follow up scheduled for 08/18/2017

## 2017-08-13 ENCOUNTER — Other Ambulatory Visit: Payer: Self-pay | Admitting: Family Medicine

## 2017-08-18 ENCOUNTER — Ambulatory Visit: Payer: BLUE CROSS/BLUE SHIELD | Admitting: Family Medicine

## 2017-08-18 NOTE — Progress Notes (Deleted)
       Patient: Omar Watson Male    DOB: Jan 01, 1956   61 y.o.   MRN: [id number] Visit Date: 08/18/2017  Today's Provider: Lelon Huh, MD   No chief complaint on file.  Subjective:    HPI   Hypertension, follow-up:  BP Readings from Last 3 Encounters:  03/09/16 130/64  10/23/15 (!) 148/62  06/27/14 140/80    He was last seen for hypertension 10/23/2015.  BP at that visit was 148/62. Management since that visit includes; no changes .He reports {excellent/good/fair/poor:19665} compliance with treatment. He {ACTION; IS/IS [id number]} having side effects. *** He {is/is not:9024} exercising. He {is/is not:9024} adherent to low salt diet.   Outside blood pressures are ***. He is experiencing {Symptoms; cardiac:12860}.  Patient denies {Symptoms; cardiac:12860}.   Cardiovascular risk factors include {cv risk factors:510}.  Use of agents associated with hypertension: {bp agents assoc with hypertension:511::"none"}.   ------------------------------------------------------------------------    Lipid/Cholesterol, Follow-up:   Last seen for this 10/23/2015. Management since that visit includes; labs checked, no changes.  Last Lipid Panel:    Component Value Date/Time   CHOL 216 (H) 10/23/2015 1054   TRIG 316 (H) 10/23/2015 1054   HDL 57 10/23/2015 1054   CHOLHDL 3.8 10/23/2015 1054   LDLCALC 96 10/23/2015 1054    He reports {excellent/good/fair/poor:19665} compliance with treatment. He {ACTION; IS/IS [id number]} having side effects. ***  Wt Readings from Last 3 Encounters:  03/09/16 161 lb (73 kg)  10/23/15 159 lb (72.1 kg)  06/27/14 164 lb (74.4 kg)    ------------------------------------------------------------------------  Gastroesophageal reflux disease without esophagitis From 10/23/2015-Well controlled with omeprazole.   Tobacco abuse From 10/23/2015-Extensively counseled on importance of quitting smoking, and medications available to help stop  smoking.   Gout without tophus, unspecified cause, unspecified chronicity, unspecified site From 10/23/2015-Well controlled. Continue allopurinol.     Allergies  Allergen Reactions  . Penicillins      Current Outpatient Medications:  .  allopurinol (ZYLOPRIM) 100 MG tablet, TAKE 1 TABLET BY MOUTH EVERY DAY, Disp: 30 tablet, Rfl: 0 .  amLODipine (NORVASC) 10 MG tablet, TAKE 1 TABLET BY MOUTH EVERY DAY, Disp: 30 tablet, Rfl: 0 .  omeprazole (PRILOSEC) 20 MG capsule, TAKE 1 CAPSULE BY MOUTH EVERY DAY* PATIENT NEEDS APPT*, Disp: 30 capsule, Rfl: 0 .  sildenafil (REVATIO) 20 MG tablet, TAKE 3-5 TABLETS AS NEEDED PRIOR TO INTERCOURSE, NOT TO EXCEED 5 TABLETS IN A DAY, Disp: 30 tablet, Rfl: 5  Review of Systems  Constitutional: Negative for appetite change, chills and fever.  Respiratory: Negative for chest tightness, shortness of breath and wheezing.   Cardiovascular: Negative for chest pain and palpitations.  Gastrointestinal: Negative for abdominal pain, nausea and vomiting.    Social History   Tobacco Use  . Smoking status: Current Every Day Smoker    Packs/day: 1.00    Years: 25.00    Pack years: 25.00    Types: Cigarettes  Substance Use Topics  . Alcohol use: Yes    Alcohol/week: 0.0 oz    Comment: drinks 6 beers daily    Objective:   There were no vitals taken for this visit. There were no vitals filed for this visit.   Physical Exam      Assessment & Plan:           Lelon Huh, MD  [facility]

## 2017-09-22 ENCOUNTER — Other Ambulatory Visit: Payer: Self-pay | Admitting: Family Medicine

## 2017-09-22 DIAGNOSIS — I10 Essential (primary) hypertension: Secondary | ICD-10-CM

## 2017-09-22 MED ORDER — AMLODIPINE BESYLATE 10 MG PO TABS: 10.0000 mg | ORAL_TABLET | Freq: Every day | ORAL | 0 refills | Status: DC

## 2017-09-22 NOTE — Telephone Encounter (Signed)
Patient needs a refill for amLODipine (NORVASC) 10 MG tablet.  He states that he missed his last appointment due to his best friend and mother passing away and can not come in for a couple of weeks and would like to know if he can get enough to last until he can come in for an appointment on 10/06/2017.  He uses [facility].

## 2017-09-22 NOTE — Telephone Encounter (Signed)
Pt informed via VM

## 2017-10-05 NOTE — Progress Notes (Signed)
Patient: Omar Watson Male    DOB: 1956/09/11   62 y.o.   MRN: [id number] Visit Date: 10/05/2017  Today's Provider: Lelon Huh, MD   Chief Complaint  Patient presents with  . Hypertension    follow up   . Gastroesophageal Reflux    follow up   . Gout    follow up   . Hyperlipidemia    follow up   . Hyperglycemia    follow up    Subjective:    HPI   Hypertension, follow-up:  BP Readings from Last 3 Encounters:  03/09/16 130/64  10/23/15 (!) 148/62  06/27/14 140/80    He was last seen for hypertension 10/23/2015.  BP at that visit was 148/62. Management since that visit includes; no changes.He reports good compliance with treatment. He is not having side effects.  He is not exercising. He is not adherent to low salt diet.   Outside blood pressures are not being checked. He is experiencing none.  Patient denies chest pain, chest pressure/discomfort, claudication, dyspnea, exertional chest pressure/discomfort, fatigue, irregular heart beat, near-syncope, orthopnea, palpitations, paroxysmal nocturnal dyspnea, syncope and tachypnea.   Cardiovascular risk factors include advanced age (older than 48 for men, 68 for women), hypertension, male gender and smoking/ tobacco exposure.  Use of agents associated with hypertension: none.   ------------------------------------------------------------------------    Lipid/Cholesterol, Follow-up:   Last seen for this 10/23/2015.  Management since that visit includes; labs checked, no changes.  Last Lipid Panel:    Component Value Date/Time   CHOL 216 (H) 10/23/2015 1054   TRIG 316 (H) 10/23/2015 1054   HDL 57 10/23/2015 1054   CHOLHDL 3.8 10/23/2015 1054   LDLCALC 96 10/23/2015 1054    He reports good compliance with treatment. He is not having side effects.   Wt Readings from Last 3 Encounters:  03/09/16 161 lb (73 kg)  10/23/15 159 lb (72.1 kg)  06/27/14 164 lb (74.4 kg)     ------------------------------------------------------------------------   Gastroesophageal reflux disease without esophagitis From 10/23/2015-no changes. Well controlled with omeprazole. Patient reports this problem is completely controlled with medication.  Tobacco abuse From 10/23/2015-Extensively counseled on importance of quitting smoking, and medications available to help stop smoking. Patient has no interest in quitting smoking. At this time.   Gout without tophus, unspecified cause, unspecified chronicity, unspecified site From 10/23/2015-no changes. Well controlled, continue allopurinol. States he has had no gout attacks for years.   Hyperglycemia From 10/23/2015-labs checked, showing blood sugar was elevated. Recommended avoiding sweets. Patient reports that he has not been watching his diet.  No results found for: HGBA1C . BMET    Component Value Date/Time   NA 135 10/23/2015 1054   K 4.1 10/23/2015 1054   CL 95 (L) 10/23/2015 1054   CO2 23 10/23/2015 1054   GLUCOSE 122 (H) 10/23/2015 1054   BUN 9 10/23/2015 1054   CREATININE 1.06 10/23/2015 1054   CALCIUM 10.3 (H) 10/23/2015 1054   GFRNONAA 78 10/23/2015 1054   GFRAA 90 10/23/2015 1054        Allergies  Allergen Reactions  . Penicillins      Current Outpatient Medications:  .  allopurinol (ZYLOPRIM) 100 MG tablet, TAKE 1 TABLET BY MOUTH EVERY DAY, Disp: 30 tablet, Rfl: 0 .  amLODipine (NORVASC) 10 MG tablet, Take 1 tablet (10 mg total) by mouth daily., Disp: 30 tablet, Rfl: 0 .  omeprazole (PRILOSEC) 20 MG capsule, TAKE 1 CAPSULE BY MOUTH EVERY DAY*  PATIENT NEEDS APPT*, Disp: 30 capsule, Rfl: 0 .  sildenafil (REVATIO) 20 MG tablet, TAKE 3-5 TABLETS AS NEEDED PRIOR TO INTERCOURSE, NOT TO EXCEED 5 TABLETS IN A DAY, Disp: 30 tablet, Rfl: 5  Review of Systems  Constitutional: Negative for appetite change, chills and fever.  Respiratory: Negative for chest tightness, shortness of breath and wheezing.    Cardiovascular: Negative for chest pain and palpitations.  Gastrointestinal: Negative for abdominal pain, nausea and vomiting.    Social History   Tobacco Use  . Smoking status: Current Every Day Smoker    Packs/day: 1.50    Years: 32.00    Pack years: 48.00    Types: Cigarettes  . Smokeless tobacco: Never Used  . Tobacco comment: smoked 1-2 ppd since age 51  Substance Use Topics  . Alcohol use: Yes    Alcohol/week: 0.0 oz    Comment: drinks 6 beers daily    Objective:   BP 140/70 (BP Location: Right Arm, Patient Position: Sitting, Cuff Size: Large)   Pulse 78   Temp 97.9 F (36.6 C) (Oral)   Resp 16   Wt 164 lb (74.4 kg)   SpO2 100% Comment: room air  BMI 25.69 kg/m  There were no vitals filed for this visit.   Physical Exam   General Appearance:    Alert, cooperative, no distress  Eyes:    PERRL, conjunctiva/corneas clear, EOM's intact       Lungs:     Clear to auscultation bilaterally, respirations unlabored  Heart:    Regular rate and rhythm  Neurologic:   Awake, alert, oriented x 3. No apparent focal neurological           defect.           Assessment & Plan:     1. Essential (primary) hypertension Well controlled.  Continue current medications.    2. Gastroesophageal reflux disease without esophagitis Doing well with PPI  3. Hypertriglyceridemia No medications at this time. Check fasting lipids.  - Comprehensive metabolic panel - Lipid panel  4. Hyperglycemia  - Hemoglobin A1c  5. Need for hepatitis C screening test  - Hepatitis C antibody  6. Smoking greater than 30 pack years  - Ambulatory Referral for Lung Cancer Scre  7.  Prostate cancer screening  - PSA  Recommend he start ECASA due to his cardiac risks.        Lelon Huh, MD  [facility]

## 2017-10-06 ENCOUNTER — Ambulatory Visit (INDEPENDENT_AMBULATORY_CARE_PROVIDER_SITE_OTHER): Payer: BLUE CROSS/BLUE SHIELD | Admitting: Family Medicine

## 2017-10-06 ENCOUNTER — Encounter: Payer: Self-pay | Admitting: Family Medicine

## 2017-10-06 VITALS — BP 140/70 | HR 78 | Temp 97.9°F | Resp 16 | Wt 164.0 lb

## 2017-10-06 DIAGNOSIS — R739 Hyperglycemia, unspecified: Secondary | ICD-10-CM

## 2017-10-06 DIAGNOSIS — K219 Gastro-esophageal reflux disease without esophagitis: Secondary | ICD-10-CM | POA: Diagnosis not present

## 2017-10-06 DIAGNOSIS — E781 Pure hyperglyceridemia: Secondary | ICD-10-CM

## 2017-10-06 DIAGNOSIS — Z1159 Encounter for screening for other viral diseases: Secondary | ICD-10-CM | POA: Diagnosis not present

## 2017-10-06 DIAGNOSIS — F17210 Nicotine dependence, cigarettes, uncomplicated: Secondary | ICD-10-CM

## 2017-10-06 DIAGNOSIS — I10 Essential (primary) hypertension: Secondary | ICD-10-CM | POA: Diagnosis not present

## 2017-10-06 DIAGNOSIS — Z125 Encounter for screening for malignant neoplasm of prostate: Secondary | ICD-10-CM

## 2017-10-06 NOTE — Patient Instructions (Signed)
   Recommend taking 81mg enteric coated aspirin to reduce risk of vascular events such as heart attacks and strokes.    

## 2017-10-10 ENCOUNTER — Telehealth: Payer: Self-pay | Admitting: *Deleted

## 2017-10-10 ENCOUNTER — Encounter: Payer: Self-pay | Admitting: *Deleted

## 2017-10-10 NOTE — Telephone Encounter (Signed)
Received referral for low dose lung cancer screening CT scan. Message left at phone number listed in EMR for patient to call me back to facilitate scheduling scan.  

## 2017-10-22 ENCOUNTER — Telehealth: Payer: Self-pay | Admitting: Family Medicine

## 2017-10-22 NOTE — Telephone Encounter (Signed)
Please check with patient about labs ordered 10-01-2017. We still haven't got these results  Also , reviewed chart and he is overdue for follow up of pre-cancerous colon polyp from his last colonoscopy in 2010. Needs referral to GI for follow up .

## 2017-10-22 NOTE — Telephone Encounter (Signed)
-----   Message from Birdie Sons, MD sent at 10/10/2017  1:31 PM EST ----- Regarding: FW:  NEEDS REFERRAL GI FOR FOLLOW UP COLONOSCOPY   ----- Message ----- From: Birdie Sons, MD Sent: 10/09/2017 To: Birdie Sons, MD Subject: FW:  NEEDS REFERRAL GI FOR FOLLOW UP COLONOS#    ----- Message ----- From: Birdie Sons, MD Sent: 10/06/2017   9:16 AM To: Birdie Sons, MD Subject:  NEEDS REFERRAL GI FOR FOLLOW UP COLONOSCOPY   66mm tubular adenoma 2010. Dr. Allen Norris

## 2017-10-23 NOTE — Telephone Encounter (Signed)
Left message to call back  

## 2017-10-24 ENCOUNTER — Other Ambulatory Visit: Payer: Self-pay | Admitting: Family Medicine

## 2017-10-24 ENCOUNTER — Telehealth: Payer: Self-pay | Admitting: *Deleted

## 2017-10-24 NOTE — Telephone Encounter (Signed)
Received referral for low dose lung cancer screening CT scan. Message left at phone number listed in EMR for patient to call me back to facilitate scheduling scan.  

## 2017-10-26 NOTE — Telephone Encounter (Signed)
LMOVM for pt to return call 

## 2017-10-30 ENCOUNTER — Telehealth: Payer: Self-pay | Admitting: *Deleted

## 2017-10-30 NOTE — Telephone Encounter (Signed)
Received referral for low dose lung cancer screening CT scan. Message left at phone number listed in EMR for patient to call me back to facilitate scheduling scan.  

## 2017-10-31 NOTE — Telephone Encounter (Signed)
Tried calling patient and unable to contact him. Letter was mailed to address reminding below.

## 2017-11-16 ENCOUNTER — Encounter: Payer: Self-pay | Admitting: *Deleted

## 2017-12-28 ENCOUNTER — Other Ambulatory Visit: Payer: Self-pay | Admitting: Family Medicine

## 2017-12-28 MED ORDER — ALLOPURINOL 100 MG PO TABS: 100.0000 mg | ORAL_TABLET | Freq: Every day | ORAL | 0 refills | Status: DC

## 2017-12-28 NOTE — Telephone Encounter (Signed)
Pharmacy requesting refill.

## 2017-12-28 NOTE — Telephone Encounter (Signed)
[facility] faxed a refill request for the following medication. Thanks CC  allopurinol (ZYLOPRIM) 100 MG tablet

## 2017-12-29 ENCOUNTER — Other Ambulatory Visit: Payer: Self-pay | Admitting: Family Medicine

## 2018-01-16 ENCOUNTER — Other Ambulatory Visit: Payer: Self-pay | Admitting: Family Medicine

## 2018-01-16 DIAGNOSIS — I10 Essential (primary) hypertension: Secondary | ICD-10-CM

## 2018-01-19 ENCOUNTER — Ambulatory Visit: Payer: BLUE CROSS/BLUE SHIELD | Admitting: Family Medicine

## 2018-02-13 ENCOUNTER — Other Ambulatory Visit: Payer: Self-pay | Admitting: Family Medicine

## 2018-04-05 ENCOUNTER — Encounter: Payer: Self-pay | Admitting: Family Medicine

## 2018-04-05 ENCOUNTER — Ambulatory Visit (INDEPENDENT_AMBULATORY_CARE_PROVIDER_SITE_OTHER): Payer: BLUE CROSS/BLUE SHIELD | Admitting: Family Medicine

## 2018-04-05 VITALS — BP 132/76 | HR 78 | Temp 98.6°F | Resp 18 | Wt 159.0 lb

## 2018-04-05 DIAGNOSIS — I10 Essential (primary) hypertension: Secondary | ICD-10-CM

## 2018-04-05 DIAGNOSIS — G4700 Insomnia, unspecified: Secondary | ICD-10-CM | POA: Diagnosis not present

## 2018-04-05 DIAGNOSIS — M1A9XX0 Chronic gout, unspecified, without tophus (tophi): Secondary | ICD-10-CM | POA: Diagnosis not present

## 2018-04-05 DIAGNOSIS — E119 Type 2 diabetes mellitus without complications: Secondary | ICD-10-CM | POA: Diagnosis not present

## 2018-04-05 DIAGNOSIS — E781 Pure hyperglyceridemia: Secondary | ICD-10-CM | POA: Diagnosis not present

## 2018-04-05 DIAGNOSIS — Z125 Encounter for screening for malignant neoplasm of prostate: Secondary | ICD-10-CM

## 2018-04-05 DIAGNOSIS — F1010 Alcohol abuse, uncomplicated: Secondary | ICD-10-CM | POA: Insufficient documentation

## 2018-04-05 DIAGNOSIS — F17210 Nicotine dependence, cigarettes, uncomplicated: Secondary | ICD-10-CM | POA: Diagnosis not present

## 2018-04-05 LAB — POCT GLYCOSYLATED HEMOGLOBIN (HGB A1C)
ESTIMATED AVERAGE GLUCOSE: 223
HEMOGLOBIN A1C: 9.4 % — AB (ref 4.0–5.6)

## 2018-04-05 MED ORDER — ALLOPURINOL 100 MG PO TABS: 100.0000 mg | ORAL_TABLET | Freq: Every day | ORAL | 3 refills | Status: DC

## 2018-04-05 MED ORDER — BUPROPION HCL ER (SR) 150 MG PO TB12: ORAL_TABLET | ORAL | 6 refills | Status: DC

## 2018-04-05 MED ORDER — ZOLPIDEM TARTRATE 10 MG PO TABS: 10.0000 mg | ORAL_TABLET | Freq: Every evening | ORAL | 1 refills | Status: DC | PRN

## 2018-04-05 NOTE — Patient Instructions (Addendum)
   Alcohol increases cholesterol and blood sugar and contributes to heart disease. Limit alcohol to no more than servings a day   Cigarette smoking causes heart attacks. Quit smoking as soon as possible. Bupropion helps reduced cigarette cravings and makes it easier to quit.    The 10-year ASCVD risk score Mikey Bussing DC Brooke Bonito., et al., 2013) is: 27.7%   Values used to calculate the score:     Age: 62 years     Sex: Male     Is Non-Hispanic African American: No     Diabetic: Yes     Tobacco smoker: Yes     Systolic Blood Pressure: 584 mmHg     Is BP treated: Yes     HDL Cholesterol: 57 mg/dL     Total Cholesterol: 216 mg/dL

## 2018-04-05 NOTE — Progress Notes (Signed)
Patient: Omar Watson Male    DOB: 1956-02-09   62 y.o.   MRN: [id number] Visit Date: 04/05/2018  Today's Provider: Lelon Huh, MD   Chief Complaint  Patient presents with  . Hypertension  . Gastroesophageal Reflux  . Hyperglycemia  . Hyperlipidemia   Subjective:    HPI   Hypertension, follow-up:  BP Readings from Last 3 Encounters:  04/05/18 132/76  10/06/17 140/70  03/09/16 130/64    He was last seen for hypertension 6 months ago.  BP at that visit was 140/70. Management since that visit includes; no changes.He reports good compliance with treatment. He is not having side effects.  He is not exercising. He is not adherent to low salt diet.   Outside blood pressures are not being checked. He is experiencing none.  Patient denies chest pain, chest pressure/discomfort, claudication, dyspnea, exertional chest pressure/discomfort, fatigue, irregular heart beat, lower extremity edema, near-syncope, orthopnea, palpitations, paroxysmal nocturnal dyspnea, syncope and tachypnea.   Cardiovascular risk factors include advanced age (older than 88 for men, 33 for women), dyslipidemia, hypertension, male gender and smoking/ tobacco exposure.  Use of agents associated with hypertension: NSAIDS.   ------------------------------------------------------------------------    Lipid/Cholesterol, Follow-up:   Last seen for this 6 months ago.  Management since that visit includes; labs ordered, not done. Was previously on fenofibrate  Last Lipid Panel:    Component Value Date/Time   CHOL 216 (H) 10/23/2015 1054   TRIG 316 (H) 10/23/2015 1054   HDL 57 10/23/2015 1054   CHOLHDL 3.8 10/23/2015 1054   LDLCALC 96 10/23/2015 1054    He reports poor compliance with treatment. He is not having side effects.   Wt Readings from Last 3 Encounters:  04/05/18 159 lb (72.1 kg)  10/06/17 164 lb (74.4 kg)  03/09/16 161 lb (73 kg)     ------------------------------------------------------------------------  Gastroesophageal reflux disease without esophagitis From 10/06/2017-no changes. Doing well with PPI. Patient reports symptoms are controlled on current medication.   Hyperglycemia From 10/06/2017-a1c ordered, but neve done. Patient has not been watching his diet or exercising.   He also request prescription for zolpidem which has been prescribed occasionally in the past and did well with. He only has occasional trouble sleeping.   The 10-year ASCVD risk score Mikey Bussing DC Brooke Bonito., et al., 2013) is: 27.7%   Values used to calculate the score:     Age: 22 years     Sex: Male     Is Non-Hispanic African American: No     Diabetic: Yes     Tobacco smoker: Yes     Systolic Blood Pressure: 024 mmHg     Is BP treated: Yes     HDL Cholesterol: 57 mg/dL     Total Cholesterol: 216 mg/dL   Allergies  Allergen Reactions  . Penicillins      Current Outpatient Medications:  .  allopurinol (ZYLOPRIM) 100 MG tablet, TAKE 1 TABLET BY MOUTH EVERY DAY. **PATIENT NEEDS APPT FOR FURTHER FILLS*, Disp: 14 tablet, Rfl: 0 .  amLODipine (NORVASC) 10 MG tablet, TAKE 1 TABLET BY MOUTH EVERY DAY, Disp: 30 tablet, Rfl: 5 .  aspirin EC 81 MG tablet, Take 81 mg by mouth daily., Disp: , Rfl:  .  omeprazole (PRILOSEC) 20 MG capsule, Take 1 capsule (20 mg total) by mouth daily., Disp: 30 capsule, Rfl: 3 .  sildenafil (REVATIO) 20 MG tablet, TAKE 3-5 TABLETS AS NEEDED PRIOR TO INTERCOURSE, NOT TO EXCEED 5 TABLETS IN A  DAY, Disp: 30 tablet, Rfl: 5  Review of Systems  Constitutional: Negative for appetite change, chills and fever.  Respiratory: Negative for chest tightness, shortness of breath and wheezing.   Cardiovascular: Negative for chest pain and palpitations.  Gastrointestinal: Negative for abdominal pain, nausea and vomiting.  Psychiatric/Behavioral: Positive for sleep disturbance.    Social History   Tobacco Use  . Smoking  status: Current Every Day Smoker    Packs/day: 1.50    Years: 42.00    Pack years: 63.00    Types: Cigarettes  . Smokeless tobacco: Never Used  . Tobacco comment: smoked 1-2 ppd since age 89  Substance Use Topics  . Alcohol use: Yes    Alcohol/week: 0.0 oz    Comment: drinks 6 beers daily    Objective:   BP 132/76 (BP Location: Right Arm, Cuff Size: Normal)   Pulse 78   Temp 98.6 F (37 C)   Resp 18   Wt 159 lb (72.1 kg)   SpO2 95% Comment: room air  BMI 24.90 kg/m  Vitals:   04/05/18 0833 04/05/18 0835  BP: 122/72 132/76  Pulse: 78   Resp: 18   Temp: 98.6 F (37 C)   SpO2: 95%   Weight: 159 lb (72.1 kg)      Physical Exam   General Appearance:    Alert, cooperative, no distress, appears stated age  Head:    Normocephalic, without obvious abnormality, atraumatic  Eyes:    PERRL, conjunctiva/corneas clear, EOM's intact, fundi    benign, both eyes       Ears:    Normal TM's and external ear canals, both ears  Nose:   Nares normal, septum midline, mucosa normal, no drainage   or sinus tenderness  Throat:   Lips, mucosa, and tongue normal; teeth and gums normal  Neck:   Supple, symmetrical, trachea midline, no adenopathy;       thyroid:  No enlargement/tenderness/nodules; no carotid   bruit or JVD  Back:     Symmetric, no curvature, ROM normal, no CVA tenderness  Lungs:     Clear to auscultation bilaterally, respirations unlabored  Chest wall:    No tenderness or deformity  Heart:    Regular rate and rhythm, S1 and S2 normal, no murmur, rub   or gallop  Abdomen:     Soft, non-tender, bowel sounds active all four quadrants,    no masses, no organomegaly  Genitalia:    deferred  Rectal:    deferred  Extremities:   Extremities normal, atraumatic, no cyanosis or edema  Pulses:   2+ and symmetric all extremities  Skin:   Skin color, texture, turgor normal, no rashes or lesions  Lymph nodes:   Cervical, supraclavicular, and axillary nodes normal  Neurologic:    CNII-XII intact. Normal strength, sensation and reflexes      throughout     Results for orders placed or performed in visit on 04/05/18  POCT HgB A1C  Result Value Ref Range   Hemoglobin A1C 9.4 (A) 4.0 - 5.6 %   HbA1c POC (<> result, manual entry)  4.0 - 5.6 %   HbA1c, POC (prediabetic range)  5.7 - 6.4 %   HbA1c, POC (controlled diabetic range)  0.0 - 7.0 %   Est. average glucose Bld gHb Est-mCnc 223      Office Visit from 04/05/2018 in [place]  AUDIT-C Score  8         Assessment & Plan:  1. Diabetes mellitus without complication ([place]) Newly diagnosed. Counseled to reduce alcohol to 1-2 servings a day and avoid sweets and starchy foods. Check labs.consider metformin if normal kidney functions.  - POCT HgB A1C - TSH - CBC - Direct LDL  2. Hypertriglyceridemia Will likely need statin.  - Comprehensive metabolic panel - Lipid panel - TSH - CBC - Direct LDL  3. Smoking greater than 30 pack years Strongly counseled on risk of heart disease and stroke associated with smoking and need to stop. He isn't read but did accept printed prescription - buPROPion (WELLBUTRIN SR) 150 MG 12 hr tablet; 1 tablet daily for 3 days, then 1 tablet twice daily. Stop smoking 14 days after starting medication  Dispense: 60 tablet; Refill: 6  4. Essential (primary) hypertension Well controlled.  Continue current medications.   - EKG 12-Lead  5. Prostate cancer screening  - PSA  6. Insomnia, unspecified type Did well in past with occasional use of- zolpidem (AMBIEN) 10 MG tablet; Take 1 tablet (10 mg total) by mouth at bedtime as needed for sleep.  Dispense: 20 tablet; Refill: 1  7. Chronic gout without tophus, unspecified cause, unspecified site Controlled with - allopurinol (ZYLOPRIM) 100 MG tablet; Take 1 tablet (100 mg total) by mouth daily.  Dispense: 30 tablet; Refill: 3  8. Excessive drinking alcohol Encouraged to limit alcohol to no more that 2 drinks a  day  Return in about 2 months (around 06/06/2018).         Lelon Huh, MD  [facility]

## 2018-04-06 ENCOUNTER — Telehealth: Payer: Self-pay

## 2018-04-06 ENCOUNTER — Other Ambulatory Visit: Payer: Self-pay | Admitting: Family Medicine

## 2018-04-06 DIAGNOSIS — Z86010 Personal history of colonic polyps: Secondary | ICD-10-CM

## 2018-04-06 LAB — COMPREHENSIVE METABOLIC PANEL
ALK PHOS: 102 IU/L (ref 39–117)
ALT: 16 IU/L (ref 0–44)
AST: 10 IU/L (ref 0–40)
Albumin/Globulin Ratio: 1.6 (ref 1.2–2.2)
Albumin: 4.3 g/dL (ref 3.5–5.5)
BILIRUBIN TOTAL: 0.2 mg/dL (ref 0.0–1.2)
BUN/Creatinine Ratio: 10 (ref 9–20)
BUN: 10 mg/dL (ref 6–24)
CHLORIDE: 97 mmol/L (ref 96–106)
CO2: 22 mmol/L (ref 20–29)
CREATININE: 0.99 mg/dL (ref 0.76–1.27)
Calcium: 9.6 mg/dL (ref 8.7–10.2)
GFR calc Af Amer: 96 mL/min/{1.73_m2} (ref >59)
GFR calc non Af Amer: 83 mL/min/{1.73_m2} (ref >59)
GLUCOSE: 157 mg/dL — AB (ref 65–99)
Globulin, Total: 2.7 g/dL (ref 1.5–4.5)
Potassium: 4.3 mmol/L (ref 3.5–5.2)
Sodium: 134 mmol/L (ref 134–144)
Total Protein: 7 g/dL (ref 6.0–8.5)

## 2018-04-06 LAB — CBC
Hematocrit: 41.1 % (ref 37.5–51.0)
Hemoglobin: 13.5 g/dL (ref 13.0–17.7)
MCH: 31.3 pg (ref 26.6–33.0)
MCHC: 32.8 g/dL (ref 31.5–35.7)
MCV: 95 fL (ref 79–97)
PLATELETS: 260 10*3/uL (ref 150–450)
RBC: 4.31 x10E6/uL (ref 4.14–5.80)
RDW: 14.7 % (ref 12.3–15.4)
WBC: 5.9 10*3/uL (ref 3.4–10.8)

## 2018-04-06 LAB — LIPID PANEL
CHOLESTEROL TOTAL: 184 mg/dL (ref 100–199)
Chol/HDL Ratio: 4.6 ratio (ref 0.0–5.0)
HDL: 40 mg/dL (ref >39)
LDL CALC: 101 mg/dL — AB (ref 0–99)
TRIGLYCERIDES: 216 mg/dL — AB (ref 0–149)
VLDL CHOLESTEROL CAL: 43 mg/dL — AB (ref 5–40)

## 2018-04-06 LAB — TSH: TSH: 3.46 u[IU]/mL (ref 0.450–4.500)

## 2018-04-06 LAB — PSA: Prostate Specific Ag, Serum: 0.6 ng/mL (ref 0.0–4.0)

## 2018-04-06 LAB — LDL CHOLESTEROL, DIRECT: LDL DIRECT: 118 mg/dL — AB (ref 0–99)

## 2018-04-06 MED ORDER — ATORVASTATIN CALCIUM 20 MG PO TABS: 20.0000 mg | ORAL_TABLET | Freq: Every day | ORAL | 2 refills | Status: DC

## 2018-04-06 MED ORDER — METFORMIN HCL ER 500 MG PO TB24: 500.0000 mg | ORAL_TABLET | Freq: Every day | ORAL | 2 refills | Status: DC

## 2018-04-06 NOTE — Telephone Encounter (Signed)
-----   Message from Birdie Sons, MD sent at 04/06/2018  8:06 AM EDT ----- LDL cholesterol is 118, needs to be under 70 to protect heart from diabetes. Kidney functions are good. Need to start metformin ER 500mg  once a day, #30, rf x 2 for sugar. Start atorvastatin 20mg  once a day, #30 rf x 2 for cholesterol and heart.  Also, he is overdue for colonoscopy since he had a large pre-cancerous polyp in 2010. Please advise patient. Have entered order for colonoscopy.  Follow up for diabetes in September as scheduled.

## 2018-04-06 NOTE — Telephone Encounter (Signed)
Pt advised as directed below.  RXs sent to [facility].   Thanks,   -Mickel Baas

## 2018-04-24 ENCOUNTER — Encounter: Payer: Self-pay | Admitting: *Deleted

## 2018-05-25 ENCOUNTER — Other Ambulatory Visit: Payer: Self-pay | Admitting: Family Medicine

## 2018-05-25 NOTE — Telephone Encounter (Signed)
Pharmacy requesting refills. Thanks!  

## 2018-05-26 ENCOUNTER — Other Ambulatory Visit: Payer: Self-pay | Admitting: Family Medicine

## 2018-05-26 DIAGNOSIS — M1A9XX0 Chronic gout, unspecified, without tophus (tophi): Secondary | ICD-10-CM

## 2018-06-08 ENCOUNTER — Ambulatory Visit (INDEPENDENT_AMBULATORY_CARE_PROVIDER_SITE_OTHER): Payer: BLUE CROSS/BLUE SHIELD | Admitting: Family Medicine

## 2018-06-08 ENCOUNTER — Other Ambulatory Visit: Payer: Self-pay | Admitting: Family Medicine

## 2018-06-08 ENCOUNTER — Encounter: Payer: Self-pay | Admitting: Family Medicine

## 2018-06-08 VITALS — BP 142/68 | HR 85 | Temp 98.2°F | Resp 20 | Wt 159.0 lb

## 2018-06-08 DIAGNOSIS — F17210 Nicotine dependence, cigarettes, uncomplicated: Secondary | ICD-10-CM | POA: Diagnosis not present

## 2018-06-08 DIAGNOSIS — E119 Type 2 diabetes mellitus without complications: Secondary | ICD-10-CM | POA: Diagnosis not present

## 2018-06-08 DIAGNOSIS — E781 Pure hyperglyceridemia: Secondary | ICD-10-CM | POA: Diagnosis not present

## 2018-06-08 LAB — POCT GLYCOSYLATED HEMOGLOBIN (HGB A1C)
ESTIMATED AVERAGE GLUCOSE: 269
Hemoglobin A1C: 11 % — AB (ref 4.0–5.6)

## 2018-06-08 MED ORDER — METFORMIN HCL ER 500 MG PO TB24: 500.0000 mg | ORAL_TABLET | Freq: Every day | ORAL | 2 refills | Status: DC

## 2018-06-08 MED ORDER — OMEPRAZOLE 20 MG PO CPDR: 20.0000 mg | DELAYED_RELEASE_CAPSULE | Freq: Every day | ORAL | 4 refills | Status: DC

## 2018-06-08 MED ORDER — GLUCOSE BLOOD VI STRP: ORAL_STRIP | 1 refills | Status: DC

## 2018-06-08 MED ORDER — ROSUVASTATIN CALCIUM 10 MG PO TABS: 10.0000 mg | ORAL_TABLET | Freq: Every day | ORAL | 1 refills | Status: DC

## 2018-06-08 NOTE — Patient Instructions (Signed)
   Contact [facility] at To schedule your colonoscopy [phone]   Check your blood sugar in the morning before you eat 2-3 times a week and write it down. Bring this record to your next office visit.

## 2018-06-08 NOTE — Progress Notes (Signed)
Patient: Omar Watson Male    DOB: [birth year]   62 y.o.   MRN: [id number] Visit Date: 06/08/2018  Today's Provider: Lelon Huh, MD   Chief Complaint  Patient presents with  . Diabetes  . Hyperlipidemia   Subjective:    HPI  Diabetes Mellitus Type II, Follow-up:   Lab Results  Component Value Date   HGBA1C 9.4 (A) 04/05/2018    Last seen for diabetes 2 months ago.  Management since then includes starting Metformin ER 500mg  daily. He reports poor compliance with treatment. Patient never started Metformin.  Current symptoms include numbness of left hand and have been stable. Home blood sugar records: blood sugars are not checked at home     Current Insulin Regimen: none Most Recent Eye Exam: >1 year ago Weight trend: fluctuating a bit Prior visit with dietician: no Current diet: in general, an "unhealthy" diet Current exercise: none  Pertinent Labs:    Component Value Date/Time   CHOL 184 04/05/2018 0922   TRIG 216 (H) 04/05/2018 0922   HDL 40 04/05/2018 0922   LDLCALC 101 (H) 04/05/2018 0922   CREATININE 0.99 04/05/2018 0922    Wt Readings from Last 3 Encounters:  06/08/18 159 lb (72.1 kg)  04/05/18 159 lb (72.1 kg)  10/06/17 164 lb (74.4 kg)    ------------------------------------------------------------------------  Lipid/Cholesterol, Follow-up:   Last seen for this2 months ago.  Management changes since that visit include prescribing Atorvastatin 20mg  daily. . Last Lipid Panel:    Component Value Date/Time   CHOL 184 04/05/2018 0922   TRIG 216 (H) 04/05/2018 0922   HDL 40 04/05/2018 0922   CHOLHDL 4.6 04/05/2018 0922   LDLCALC 101 (H) 04/05/2018 0922   LDLDIRECT 118 (H) 04/05/2018 4665    Risk factors for vascular disease include diabetes mellitus and hypercholesterolemia  He states he never picked up prescription for atorvastatin at pharmacy.   Wt Readings from Last 3 Encounters:  06/08/18 159 lb (72.1 kg)  04/05/18 159 lb  (72.1 kg)  10/06/17 164 lb (74.4 kg)    -------------------------------------------------------------------     Allergies  Allergen Reactions  . Penicillins      Current Outpatient Medications:  .  allopurinol (ZYLOPRIM) 100 MG tablet, TAKE 1 TABLET BY MOUTH EVERY DAY, Disp: 30 tablet, Rfl: 11 .  amLODipine (NORVASC) 10 MG tablet, TAKE 1 TABLET BY MOUTH EVERY DAY, Disp: 30 tablet, Rfl: 5 .  omeprazole (PRILOSEC) 20 MG capsule, TAKE 1 CAPSULE BY MOUTH EVERY DAY, Disp: 30 capsule, Rfl: 2 .  aspirin EC 81 MG tablet, Take 81 mg by mouth daily., Disp: , Rfl:  .  atorvastatin (LIPITOR) 20 MG tablet, Take 1 tablet (20 mg total) by mouth daily. (Patient not taking: Reported on 06/08/2018), Disp: 30 tablet, Rfl: 2 .  buPROPion (WELLBUTRIN SR) 150 MG 12 hr tablet, 1 tablet daily for 3 days, then 1 tablet twice daily. Stop smoking 14 days after starting medication (Patient not taking: Reported on 06/08/2018), Disp: 60 tablet, Rfl: 6 .  metFORMIN (GLUCOPHAGE-XR) 500 MG 24 hr tablet, Take 1 tablet (500 mg total) by mouth daily with breakfast. (Patient not taking: Reported on 06/08/2018), Disp: 30 tablet, Rfl: 2 .  sildenafil (REVATIO) 20 MG tablet, TAKE 3-5 TABLETS AS NEEDED PRIOR TO INTERCOURSE, NOT TO EXCEED 5 TABLETS IN A DAY (Patient not taking: Reported on 06/08/2018), Disp: 30 tablet, Rfl: 5 .  zolpidem (AMBIEN) 10 MG tablet, Take 1 tablet (10 mg total) by mouth  at bedtime as needed for sleep. (Patient not taking: Reported on 06/08/2018), Disp: 20 tablet, Rfl: 1  Review of Systems  Constitutional: Positive for fatigue. Negative for appetite change, chills and fever.  Respiratory: Negative for chest tightness, shortness of breath and wheezing.   Cardiovascular: Negative for chest pain and palpitations.  Gastrointestinal: Negative for abdominal pain, nausea and vomiting.  Endocrine: Positive for polydipsia.  Neurological: Positive for numbness (in left hand).    Social History   Tobacco Use  .  Smoking status: Current Every Day Smoker    Packs/day: 1.50    Years: 42.00    Pack years: 63.00    Types: Cigarettes  . Smokeless tobacco: Never Used  . Tobacco comment: smoked 1-2 ppd since age 61  Substance Use Topics  . Alcohol use: Yes    Alcohol/week: 0.0 standard drinks    Comment: drinks 6 beers daily    Objective:   BP (!) 142/68 (BP Location: Left Arm, Cuff Size: Large)   Pulse 85   Temp 98.2 F (36.8 C) (Oral)   Resp 20   Wt 159 lb (72.1 kg)   SpO2 99% Comment: room air  BMI 24.90 kg/m  Vitals:   06/08/18 0811 06/08/18 0819  BP: (!) 164/81 (!) 142/68  Pulse: 85   Resp: 20   Temp: 98.2 F (36.8 C)   TempSrc: Oral   SpO2: 99%   Weight: 159 lb (72.1 kg)      Physical Exam  General appearance: alert, well developed, well nourished, cooperative and in no distress Head: Normocephalic, without obvious abnormality, atraumatic Respiratory: Respirations even and unlabored, normal respiratory rate Extremities: No gross deformities Skin: Skin color, texture, turgor normal. No rashes seen  Psych: Appropriate mood and affect. Neurologic: Mental status: Alert, oriented to person, place, and time, thought content appropriate.  Results for orders placed or performed in visit on 06/08/18  POCT HgB A1C  Result Value Ref Range   Hemoglobin A1C 11.0 (A) 4.0 - 5.6 %   HbA1c POC (<> result, manual entry)     HbA1c, POC (prediabetic range)     HbA1c, POC (controlled diabetic range)     Est. average glucose Bld gHb Est-mCnc 269        Assessment & Plan:     1. Diabetes mellitus without complication ([facility]) Poor compliance with treatment. Counseled on appropriate dietary changes and importance of medication compliance to control blood sugar. Given Onetouch Verio meter to check sugar. He agrees to start metformin and new prescription sent to his pharmacy.  - POCT HgB A1C  2. Hypertriglyceridemia He states he did not tolerate medications in the past. He was prescribed  fenofibrate several years ago, but I don't see a statin on his past medication list. Will try rosuvastatin 10mg  daily.   3. Smoking greater than 30 pack years Reinforced importance of smoking cessation for his health.   Return in about 6 weeks (around 07/20/2018).        Lelon Huh, MD  [facility]

## 2018-07-03 ENCOUNTER — Other Ambulatory Visit: Payer: Self-pay | Admitting: Family Medicine

## 2018-07-17 ENCOUNTER — Telehealth: Payer: Self-pay | Admitting: Family Medicine

## 2018-07-17 NOTE — Telephone Encounter (Signed)
Spoke with pharmacist who states that they have received e-script. KW

## 2018-07-17 NOTE — Telephone Encounter (Signed)
Please call pharmacy re rosuvastatin prescription. Received error message that e-prescription didn't go through.

## 2018-07-20 ENCOUNTER — Encounter: Payer: Self-pay | Admitting: Family Medicine

## 2018-07-20 ENCOUNTER — Ambulatory Visit (INDEPENDENT_AMBULATORY_CARE_PROVIDER_SITE_OTHER): Payer: BLUE CROSS/BLUE SHIELD | Admitting: Family Medicine

## 2018-07-20 VITALS — BP 138/86 | HR 71 | Temp 97.6°F | Wt 165.6 lb

## 2018-07-20 DIAGNOSIS — E781 Pure hyperglyceridemia: Secondary | ICD-10-CM

## 2018-07-20 DIAGNOSIS — E1165 Type 2 diabetes mellitus with hyperglycemia: Secondary | ICD-10-CM | POA: Diagnosis not present

## 2018-07-20 DIAGNOSIS — Z23 Encounter for immunization: Secondary | ICD-10-CM

## 2018-07-20 DIAGNOSIS — F17210 Nicotine dependence, cigarettes, uncomplicated: Secondary | ICD-10-CM | POA: Diagnosis not present

## 2018-07-20 LAB — POCT GLYCOSYLATED HEMOGLOBIN (HGB A1C)
Est. average glucose Bld gHb Est-mCnc: 203
Hemoglobin A1C: 8.7 % — AB (ref 4.0–5.6)

## 2018-07-20 LAB — POCT UA - MICROALBUMIN: MICROALBUMIN (UR) POC: 20 mg/L

## 2018-07-20 NOTE — Progress Notes (Signed)
Patient: Omar Watson Male    DOB: 1956-08-08   62 y.o.   MRN: [id number] Visit Date: 07/20/2018  Today's Provider: Lelon Huh, MD   Chief Complaint  Patient presents with  . Hyperlipidemia  . Diabetes   Subjective:    HPI  Diabetes Mellitus Type II, Follow-up:   Lab Results  Component Value Date   HGBA1C 11.0 (A) 06/08/2018   HGBA1C 9.4 (A) 04/05/2018    Last seen for diabetes 6 weeks ago.  Management since then includes none. He reports fair compliance with treatment. He is not having side effects.  Current symptoms include fingers in left hand is numb. and have been unchanged. Home blood sugar records: have not been checking the reading.  Episodes of hypoglycemia? no   Current Insulin Regimen: none Most Recent Eye Exam: over a year. Weight trend: stable Prior visit with dietician: no Current diet: well balanced Current exercise: none  Pertinent Labs:    Component Value Date/Time   CHOL 184 04/05/2018 0922   TRIG 216 (H) 04/05/2018 0922   HDL 40 04/05/2018 0922   LDLCALC 101 (H) 04/05/2018 0922   CREATININE 0.99 04/05/2018 0922    Wt Readings from Last 3 Encounters:  07/20/18 165 lb 9.6 oz (75.1 kg)  06/08/18 159 lb (72.1 kg)  04/05/18 159 lb (72.1 kg)    -------------------------------------------------------------------  Lipid/Cholesterol, Follow-up:   Last seen for this 6 weeks ago.  Management changes since that visit include starting on rosuvastatin 10mg . . Last Lipid Panel:    Component Value Date/Time   CHOL 184 04/05/2018 0922   TRIG 216 (H) 04/05/2018 0922   HDL 40 04/05/2018 0922   CHOLHDL 4.6 04/05/2018 0922   LDLCALC 101 (H) 04/05/2018 0922   LDLDIRECT 118 (H) 04/05/2018 3532    Risk factors for vascular disease include diabetes mellitus and hypercholesterolemia  He reports fair compliance with treatment. He is not having side effects.  Current symptoms include visual disturbances and have been  unchanged. Weight trend: stable Prior visit with dietician: no Current diet: well balanced Current exercise: none  Wt Readings from Last 3 Encounters:  07/20/18 165 lb 9.6 oz (75.1 kg)  06/08/18 159 lb (72.1 kg)  04/05/18 159 lb (72.1 kg)    -------------------------------------------------------------------     Allergies  Allergen Reactions  . Penicillins      Current Outpatient Medications:  .  allopurinol (ZYLOPRIM) 100 MG tablet, TAKE 1 TABLET BY MOUTH EVERY DAY, Disp: 30 tablet, Rfl: 11 .  amLODipine (NORVASC) 10 MG tablet, TAKE 1 TABLET BY MOUTH EVERY DAY, Disp: 30 tablet, Rfl: 5 .  aspirin EC 81 MG tablet, Take 81 mg by mouth daily., Disp: , Rfl:  .  buPROPion (WELLBUTRIN SR) 150 MG 12 hr tablet, 1 tablet daily for 3 days, then 1 tablet twice daily. Stop smoking 14 days after starting medication, Disp: 60 tablet, Rfl: 6 .  glucose blood (ONETOUCH VERIO) test strip, Use as instructed to check blood sugar daily, Disp: 100 each, Rfl: 1 .  metFORMIN (GLUCOPHAGE-XR) 500 MG 24 hr tablet, Take 1 tablet (500 mg total) by mouth daily with breakfast. FOR 2 WEEKS, THEN INCREASE TO 1 TABLET TWICE DAILY, Disp: 60 tablet, Rfl: 2 .  omeprazole (PRILOSEC) 20 MG capsule, Take 1 capsule (20 mg total) by mouth daily., Disp: 90 capsule, Rfl: 4 .  rosuvastatin (CRESTOR) 10 MG tablet, TAKE 1 TABLET BY MOUTH EVERY DAY, Disp: 90 tablet, Rfl: 4 .  sildenafil (  REVATIO) 20 MG tablet, TAKE 3-5 TABLETS AS NEEDED PRIOR TO INTERCOURSE, NOT TO EXCEED 5 TABLETS IN A DAY, Disp: 30 tablet, Rfl: 5 .  zolpidem (AMBIEN) 10 MG tablet, Take 1 tablet (10 mg total) by mouth at bedtime as needed for sleep., Disp: 20 tablet, Rfl: 1  Review of Systems  Constitutional: Positive for fatigue.  HENT: Negative.   Eyes: Positive for visual disturbance.  Respiratory: Negative.   Cardiovascular: Negative.   Gastrointestinal: Negative.   Genitourinary: Negative.   Allergic/Immunologic: Negative.   Neurological:  Negative.     Social History   Tobacco Use  . Smoking status: Current Every Day Smoker    Packs/day: 1.50    Years: 42.00    Pack years: 63.00    Types: Cigarettes  . Smokeless tobacco: Never Used  . Tobacco comment: smoked 1-2 ppd since age 75  Substance Use Topics  . Alcohol use: Yes    Alcohol/week: 0.0 standard drinks    Comment: drinks 6 beers daily    Objective:   BP 138/86 (BP Location: Left Arm, Patient Position: Sitting, Cuff Size: Normal)   Pulse 71   Temp 97.6 F (36.4 C) (Oral)   Wt 165 lb 9.6 oz (75.1 kg)   SpO2 99%   BMI 25.94 kg/m  Vitals:   07/20/18 0820  BP: 138/86  Pulse: 71  Temp: 97.6 F (36.4 C)  TempSrc: Oral  SpO2: 99%  Weight: 165 lb 9.6 oz (75.1 kg)     Physical Exam   General Appearance:    Alert, cooperative, no distress  Eyes:    PERRL, conjunctiva/corneas clear, EOM's intact       Lungs:     Clear to auscultation bilaterally, respirations unlabored  Heart:    Regular rate and rhythm  Neurologic:   Awake, alert, oriented x 3. No apparent focal neurological           defect.       Results for orders placed or performed in visit on 07/20/18  POCT UA - Microalbumin  Result Value Ref Range   Microalbumin Ur, POC 20 mg/L   Creatinine, POC     Albumin/Creatinine Ratio, Urine, POC    POCT glycosylated hemoglobin (Hb A1C)  Result Value Ref Range   Hemoglobin A1C 8.7 (A) 4.0 - 5.6 %   HbA1c POC (<> result, manual entry)     HbA1c, POC (prediabetic range)     HbA1c, POC (controlled diabetic range)     Est. average glucose Bld gHb Est-mCnc 203         Assessment & Plan:     1. Type 2 diabetes mellitus with hyperglycemia, without long-term current use of insulin (HCC) Significantly improved now that he is taking medications consistently. Continue current medications.   - POCT UA - Microalbumin - POCT glycosylated hemoglobin (Hb A1C)  2. Hypertriglyceridemia He is tolerating rosuvastatin well with no adverse effects.   -  Comprehensive metabolic panel - Lipid panel  3. Flu vaccine need  - Flu Vaccine QUAD 6+ mos PF IM (Fluarix Quad PF)  Return in about 3 months (around 10/20/2018).       Lelon Huh, MD  [facility]

## 2018-07-20 NOTE — Patient Instructions (Addendum)
You have a history of pre-cancerous colon polyps. These could return as colon cancer if you do not get a follow up colonoscopy.  Please call [facility] at [phone] to schedule a follow up colonoscopy.

## 2018-07-29 ENCOUNTER — Other Ambulatory Visit: Payer: Self-pay | Admitting: Family Medicine

## 2018-07-29 DIAGNOSIS — I10 Essential (primary) hypertension: Secondary | ICD-10-CM

## 2018-09-02 ENCOUNTER — Other Ambulatory Visit: Payer: Self-pay | Admitting: Family Medicine

## 2018-10-03 ENCOUNTER — Other Ambulatory Visit: Payer: Self-pay | Admitting: Family Medicine

## 2018-10-26 ENCOUNTER — Encounter: Payer: Self-pay | Admitting: Family Medicine

## 2018-10-26 ENCOUNTER — Ambulatory Visit: Payer: BLUE CROSS/BLUE SHIELD | Admitting: Family Medicine

## 2018-10-26 VITALS — BP 150/70 | HR 80 | Temp 97.8°F | Resp 18 | Wt 169.0 lb

## 2018-10-26 DIAGNOSIS — E781 Pure hyperglyceridemia: Secondary | ICD-10-CM

## 2018-10-26 DIAGNOSIS — I10 Essential (primary) hypertension: Secondary | ICD-10-CM

## 2018-10-26 DIAGNOSIS — E119 Type 2 diabetes mellitus without complications: Secondary | ICD-10-CM

## 2018-10-26 DIAGNOSIS — G4710 Hypersomnia, unspecified: Secondary | ICD-10-CM

## 2018-10-26 DIAGNOSIS — Z86010 Personal history of colonic polyps: Secondary | ICD-10-CM | POA: Diagnosis not present

## 2018-10-26 DIAGNOSIS — F17210 Nicotine dependence, cigarettes, uncomplicated: Secondary | ICD-10-CM

## 2018-10-26 MED ORDER — BUPROPION HCL ER (SR) 150 MG PO TB12: ORAL_TABLET | ORAL | 6 refills | Status: DC

## 2018-10-26 NOTE — Progress Notes (Signed)
Patient: Omar Watson Male    DOB: 04-Jul-1956   63 y.o.   MRN: [id number] Visit Date: 10/26/2018  Today's Provider: Lelon Huh, MD   Chief Complaint  Patient presents with  . Diabetes  . Hyperlipidemia  . Hypertension   Subjective:     HPI  Diabetes Mellitus Type II, Follow-up:   Lab Results  Component Value Date   HGBA1C 8.7 (A) 07/20/2018   HGBA1C 11.0 (A) 06/08/2018   HGBA1C 9.4 (A) 04/05/2018    Last seen for diabetes 3 months ago.  Management since then includes no changes. He reports good compliance with treatment. He is not having side effects.  Current symptoms include none and have been stable. Home blood sugar records: blood sugars are not checked at home  Episodes of hypoglycemia? no   Current Insulin Regimen: none Most Recent Eye Exam: >1 year ago Weight trend: fluctuating a bit Prior visit with dietician: no Current diet: in general, an "unhealthy" diet Current exercise: none  Pertinent Labs:    Component Value Date/Time   CHOL 184 04/05/2018 0922   TRIG 216 (H) 04/05/2018 0922   HDL 40 04/05/2018 0922   LDLCALC 101 (H) 04/05/2018 0922   CREATININE 0.99 04/05/2018 0922    Wt Readings from Last 3 Encounters:  10/26/18 169 lb (76.7 kg)  07/20/18 165 lb 9.6 oz (75.1 kg)  06/08/18 159 lb (72.1 kg)    ------------------------------------------------------------------------  Lipid/Cholesterol, Follow-up:   Last seen for this 3 months ago.  Management changes since that visit include none. Labs were ordered but not done. . Last Lipid Panel:    Component Value Date/Time   CHOL 184 04/05/2018 0922   TRIG 216 (H) 04/05/2018 0922   HDL 40 04/05/2018 0922   CHOLHDL 4.6 04/05/2018 0922   LDLCALC 101 (H) 04/05/2018 0922   LDLDIRECT 118 (H) 04/05/2018 4665    Risk factors for vascular disease include diabetes mellitus and hypercholesterolemia  He reports good compliance with treatment. He is not having side effects.  Current  symptoms include none and have been stable. Weight trend: fluctuating a bit Prior visit with dietician: no Current diet: in general, an "unhealthy" diet Current exercise: none  Wt Readings from Last 3 Encounters:  10/26/18 169 lb (76.7 kg)  07/20/18 165 lb 9.6 oz (75.1 kg)  06/08/18 159 lb (72.1 kg)    -------------------------------------------------------------------  Hypertension, follow-up:  BP Readings from Last 3 Encounters:  10/26/18 (!) 150/70  07/20/18 138/86  06/08/18 (!) 142/68    He was last seen for hypertension 3 months ago.  BP at that visit was 138/86. Management since that visit includes no changes. He reports good compliance with treatment. He is not having side effects.  He is not exercising. He is not adherent to low salt diet.   Outside blood pressures are not being checked. He is experiencing none.  Patient denies chest pain, chest pressure/discomfort, claudication, dyspnea, exertional chest pressure/discomfort, fatigue, irregular heart beat, lower extremity edema, near-syncope, orthopnea, palpitations, paroxysmal nocturnal dyspnea, syncope and tachypnea.   Cardiovascular risk factors include advanced age (older than 58 for men, 70 for women), diabetes mellitus, dyslipidemia, hypertension and male gender.  Use of agents associated with hypertension: NSAIDS.     Weight trend: fluctuating a bit Wt Readings from Last 3 Encounters:  10/26/18 169 lb (76.7 kg)  07/20/18 165 lb 9.6 oz (75.1 kg)  06/08/18 159 lb (72.1 kg)    Current diet: in general, an "unhealthy"  diet  ------------------------------------------------------------------------ He does complain today of feeling more fatigued and sleepy over the last several months. Falls asleep easily if he stops to rest during the day or sits in front of the TV.  Lab Results  Component Value Date   WBC 5.9 04/05/2018   HGB 13.5 04/05/2018   HCT 41.1 04/05/2018   MCV 95 04/05/2018   PLT 260 04/05/2018     Lab Results  Component Value Date   TSH 3.460 04/05/2018     Allergies  Allergen Reactions  . Penicillins      Current Outpatient Medications:  .  allopurinol (ZYLOPRIM) 100 MG tablet, TAKE 1 TABLET BY MOUTH EVERY DAY, Disp: 30 tablet, Rfl: 11 .  amLODipine (NORVASC) 10 MG tablet, TAKE 1 TABLET BY MOUTH EVERY DAY, Disp: 90 tablet, Rfl: 4 .  aspirin EC 81 MG tablet, Take 81 mg by mouth daily., Disp: , Rfl:  .  glucose blood (ONETOUCH VERIO) test strip, Use as instructed to check blood sugar daily, Disp: 100 each, Rfl: 1 .  metFORMIN (GLUCOPHAGE-XR) 500 MG 24 hr tablet, TAKE 1 TABLET BY MOUTH DAILY WITH BREAKFAST. FOR 2 WEEKS, THEN INCREASE TO 1 TABLET TWICE DAILY, Disp: 180 tablet, Rfl: 4 .  omeprazole (PRILOSEC) 20 MG capsule, Take 1 capsule (20 mg total) by mouth daily., Disp: 90 capsule, Rfl: 4 .  rosuvastatin (CRESTOR) 10 MG tablet, TAKE 1 TABLET BY MOUTH EVERY DAY, Disp: 90 tablet, Rfl: 4 .  sildenafil (REVATIO) 20 MG tablet, TAKE 3-5 TABLETS AS NEEDED PRIOR TO INTERCOURSE, NOT TO EXCEED 5 TABLETS IN A DAY, Disp: 30 tablet, Rfl: 5 .  zolpidem (AMBIEN) 10 MG tablet, Take 1 tablet (10 mg total) by mouth at bedtime as needed for sleep., Disp: 20 tablet, Rfl: 1 .  buPROPion (WELLBUTRIN SR) 150 MG 12 hr tablet, 1 tablet daily for 3 days, then 1 tablet twice daily. Stop smoking 14 days after starting medication, Disp: 60 tablet, Rfl: 6  Review of Systems  Constitutional: Positive for fatigue. Negative for appetite change, chills and fever.  Respiratory: Negative for chest tightness, shortness of breath and wheezing.   Cardiovascular: Negative for chest pain and palpitations.  Gastrointestinal: Negative for abdominal pain, nausea and vomiting.    Social History   Tobacco Use  . Smoking status: Current Every Day Smoker    Packs/day: 1.50    Years: 42.00    Pack years: 63.00    Types: Cigarettes  . Smokeless tobacco: Never Used  . Tobacco comment: smoked 1-2 ppd since age 57   Substance Use Topics  . Alcohol use: Yes    Alcohol/week: 0.0 standard drinks    Comment: drinks 6 beers daily       Objective:   BP (!) 150/70 (BP Location: Right Arm, Cuff Size: Normal)   Pulse 80   Temp 97.8 F (36.6 C) (Oral)   Resp 18   Wt 169 lb (76.7 kg)   SpO2 96% Comment: room air  BMI 26.47 kg/m  Vitals:   10/26/18 0814 10/26/18 0816  BP: (!) 152/70 (!) 150/70  Pulse: 80   Resp: 18   Temp: 97.8 F (36.6 C)   TempSrc: Oral   SpO2: 96%   Weight: 169 lb (76.7 kg)      Physical Exam  General Appearance:    Alert, cooperative, no distress,   Eyes:    PERRL, conjunctiva/corneas clear, EOM's intact       Lungs:     Clear to auscultation  bilaterally, respirations unlabored  Heart:    Regular rate and rhythm  Neurologic:   Awake, alert, oriented x 3. No apparent focal neurological           defect.          Assessment & Plan    1. Smoking greater than 30 pack years  - Ambulatory Referral for Lung Cancer Scre - buPROPion (WELLBUTRIN SR) 150 MG 12 hr tablet; 1 tablet daily for 3 days, then 1 tablet twice daily. Stop smoking 14 days after starting medication  Dispense: 60 tablet; Refill: 6  2. History of adenomatous polyp of colon reenforced importance of follow up colonoscopy to reduce risk of colon cancer.  - Ambulatory referral to Gastroenterology  3. Hypertriglyceridemia He is tolerating rosuvastatin well with no adverse effects.   - Comprehensive metabolic panel - Lipid panel - CBC  4. Essential (primary) hypertension Elevated today. Consider addition of another agent, or SLGT2 Inhibitor  5. Hypersomnia  - Home sleep test  6. Diabetes mellitus without complication ([place])  - Hemoglobin A1c     Lelon Huh, MD  [facility]

## 2018-10-26 NOTE — Patient Instructions (Addendum)
.   You had pre-cancerous colon polyps several years ago and are overdue for a colonoscopy to make sure you don't get colon cancer.         Call [facility] at [phone] to schedule a colonoscopy as soon as possible.

## 2018-10-27 LAB — COMPREHENSIVE METABOLIC PANEL
A/G RATIO: 1.9 (ref 1.2–2.2)
ALT: 40 IU/L (ref 0–44)
AST: 34 IU/L (ref 0–40)
Albumin: 4.2 g/dL (ref 3.8–4.9)
Alkaline Phosphatase: 84 IU/L (ref 39–117)
BILIRUBIN TOTAL: 0.3 mg/dL (ref 0.0–1.2)
BUN / CREAT RATIO: 12 (ref 10–24)
BUN: 11 mg/dL (ref 8–27)
CHLORIDE: 99 mmol/L (ref 96–106)
CO2: 19 mmol/L — AB (ref 20–29)
Calcium: 8.8 mg/dL (ref 8.6–10.2)
Creatinine, Ser: 0.9 mg/dL (ref 0.76–1.27)
GFR calc non Af Amer: 93 mL/min/{1.73_m2} (ref >59)
GFR, EST AFRICAN AMERICAN: 107 mL/min/{1.73_m2} (ref >59)
Globulin, Total: 2.2 g/dL (ref 1.5–4.5)
Glucose: 185 mg/dL — ABNORMAL HIGH (ref 65–99)
Potassium: 4 mmol/L (ref 3.5–5.2)
SODIUM: 135 mmol/L (ref 134–144)
Total Protein: 6.4 g/dL (ref 6.0–8.5)

## 2018-10-27 LAB — HEMOGLOBIN A1C
ESTIMATED AVERAGE GLUCOSE: 171 mg/dL
HEMOGLOBIN A1C: 7.6 % — AB (ref 4.8–5.6)

## 2018-10-27 LAB — CBC
Hematocrit: 36.3 % — ABNORMAL LOW (ref 37.5–51.0)
Hemoglobin: 12.4 g/dL — ABNORMAL LOW (ref 13.0–17.7)
MCH: 32.9 pg (ref 26.6–33.0)
MCHC: 34.2 g/dL (ref 31.5–35.7)
MCV: 96 fL (ref 79–97)
Platelets: 180 10*3/uL (ref 150–450)
RBC: 3.77 x10E6/uL — ABNORMAL LOW (ref 4.14–5.80)
RDW: 12.7 % (ref 11.6–15.4)
WBC: 5.9 10*3/uL (ref 3.4–10.8)

## 2018-10-27 LAB — LIPID PANEL
Chol/HDL Ratio: 2.2 ratio (ref 0.0–5.0)
Cholesterol, Total: 110 mg/dL (ref 100–199)
HDL: 51 mg/dL (ref >39)
LDL Calculated: 27 mg/dL (ref 0–99)
Triglycerides: 160 mg/dL — ABNORMAL HIGH (ref 0–149)
VLDL CHOLESTEROL CAL: 32 mg/dL (ref 5–40)

## 2018-10-29 ENCOUNTER — Telehealth: Payer: Self-pay

## 2018-10-29 ENCOUNTER — Telehealth: Payer: Self-pay | Admitting: *Deleted

## 2018-10-29 NOTE — Telephone Encounter (Signed)
Pt advised.   Thanks,   -Maayan Jenning  

## 2018-10-29 NOTE — Telephone Encounter (Signed)
-----   Message from Birdie Sons, MD sent at 10/27/2018  6:27 PM EST ----- a1c is better at 7.6 rest of labs are good. Continue current medications.  Schedule Follow up 4 months.

## 2018-10-29 NOTE — Telephone Encounter (Signed)
Received referral for low dose lung cancer screening CT scan. Message left at phone number listed in EMR for patient to call me back to facilitate scheduling scan.  

## 2018-10-30 NOTE — Telephone Encounter (Signed)
Received referral for low dose lung cancer screening CT scan. Message left at phone number listed in EMR for patient to call me back to facilitate scheduling scan.  

## 2018-11-05 ENCOUNTER — Encounter: Payer: Self-pay | Admitting: *Deleted

## 2018-11-06 ENCOUNTER — Telehealth: Payer: Self-pay | Admitting: *Deleted

## 2018-11-06 ENCOUNTER — Encounter: Payer: Self-pay | Admitting: *Deleted

## 2018-11-06 NOTE — Telephone Encounter (Signed)
Received referral for low dose lung cancer screening CT scan. Message left at phone number listed in EMR for patient to call me back to facilitate scheduling scan.  

## 2018-11-08 ENCOUNTER — Telehealth: Payer: Self-pay | Admitting: Family Medicine

## 2018-11-08 NOTE — Telephone Encounter (Signed)
Order for home sleep study faxed to [facility] °

## 2018-11-09 ENCOUNTER — Other Ambulatory Visit: Payer: Self-pay | Admitting: Family Medicine

## 2018-11-09 DIAGNOSIS — F17210 Nicotine dependence, cigarettes, uncomplicated: Secondary | ICD-10-CM

## 2018-11-09 MED ORDER — BUPROPION HCL ER (SR) 150 MG PO TB12: 150.0000 mg | ORAL_TABLET | Freq: Two times a day (BID) | ORAL | 3 refills | Status: DC

## 2018-11-09 NOTE — Telephone Encounter (Signed)
Received error message on electronic prescription for bupropion. Please call [facility] and see if they got prescription, if not can call in.

## 2018-11-09 NOTE — Telephone Encounter (Signed)
I called the pharmacist.  They did not get the order.  Pharmacist refused to take a verbal over the phone and requested it to be resent.  I get an error when I try to resend it.     Thanks,   -Mickel Baas

## 2018-11-23 ENCOUNTER — Other Ambulatory Visit: Payer: Self-pay | Admitting: Family Medicine

## 2018-11-23 DIAGNOSIS — G4700 Insomnia, unspecified: Secondary | ICD-10-CM

## 2019-03-08 ENCOUNTER — Encounter: Payer: Self-pay | Admitting: Family Medicine

## 2019-03-08 ENCOUNTER — Ambulatory Visit: Payer: BC Managed Care – PPO | Admitting: Family Medicine

## 2019-03-08 ENCOUNTER — Other Ambulatory Visit: Payer: Self-pay

## 2019-03-08 VITALS — BP 166/85 | HR 85 | Temp 98.2°F | Wt 166.0 lb

## 2019-03-08 DIAGNOSIS — Z86010 Personal history of colonic polyps: Secondary | ICD-10-CM

## 2019-03-08 DIAGNOSIS — I10 Essential (primary) hypertension: Secondary | ICD-10-CM | POA: Diagnosis not present

## 2019-03-08 DIAGNOSIS — E781 Pure hyperglyceridemia: Secondary | ICD-10-CM

## 2019-03-08 DIAGNOSIS — R200 Anesthesia of skin: Secondary | ICD-10-CM | POA: Diagnosis not present

## 2019-03-08 DIAGNOSIS — R202 Paresthesia of skin: Secondary | ICD-10-CM

## 2019-03-08 DIAGNOSIS — E119 Type 2 diabetes mellitus without complications: Secondary | ICD-10-CM | POA: Diagnosis not present

## 2019-03-08 LAB — POCT GLYCOSYLATED HEMOGLOBIN (HGB A1C): Hemoglobin A1C: 7.6 % — AB (ref 4.0–5.6)

## 2019-03-08 MED ORDER — LISINOPRIL 10 MG PO TABS: 10.0000 mg | ORAL_TABLET | Freq: Every day | ORAL | 0 refills | Status: DC

## 2019-03-08 NOTE — Patient Instructions (Addendum)
.   Please review the attached list of medications and notify my office if there are any errors.    You had pre-cancerous colon polyps several years ago and are overdue for a colonoscopy to make sure you don't get colon cancer.       Call [facility] at [phone] to schedule a colonoscopy as soon as possible. You had pre-cancerous colon polyps several years ago and are overdue for a colonoscopy to make sure you don't get colon cancer.    Check with your pharmacist to see if your bottle of metformin has been recalled. If so, let me know and I'll send in new prescription for formulation that is not recalled

## 2019-03-08 NOTE — Progress Notes (Signed)
Patient: Omar Watson Male    DOB: [birth year]   63 y.o.   MRN: [id number] Visit Date: 03/08/2019  Today's Provider: Lelon Huh, MD   Chief Complaint  Patient presents with  . Hyperlipidemia  . Hypertension  . Diabetes   Subjective:     HPI     Diabetes Mellitus Type II, Follow-up:   Lab Results  Component Value Date   HGBA1C 7.6 (H) 10/26/2018   HGBA1C 8.7 (A) 07/20/2018   HGBA1C 11.0 (A) 06/08/2018   Last seen for diabetes 4 months ago.  Management since then includes No changes. He reports excellent compliance with treatment. He is not having side effects.  Current symptoms include Fatigue and have been unchanged. Home blood sugar records: Pt is not checking his blood sugar at home.  Episodes of hypoglycemia? no   Current Insulin Regimen: None Most Recent Eye Exam: Pt has not been to the eye doctor Weight trend: stable Prior visit with dietician: no Current diet: in general, an "unhealthy" diet Current exercise: none  ------------------------------------------------------------------------   Hypertension, follow-up:  BP Readings from Last 3 Encounters:  03/08/19 (!) 166/85  10/26/18 (!) 150/70  07/20/18 138/86    He was last seen for hypertension 4 months ago.  BP at that visit was 152/70. Management since that visit includes No changes He reports excellent compliance with treatment. He is not having side effects.  He is not exercising. He is not adherent to low salt diet.   Outside blood pressures are not being checked. He is experiencing lower extremity edema.  Patient denies chest pain, dyspnea, palpitations and syncope.   Cardiovascular risk factors include advanced age (older than 3 for men, 26 for women), diabetes mellitus, dyslipidemia, hypertension and male gender.  Use of agents associated with hypertension: none.   ------------------------------------------------------------------------    Lipid/Cholesterol, Follow-up:    Last seen for this 4 months ago.  Management since that visit includes no changes.  Last Lipid Panel:    Component Value Date/Time   CHOL 110 10/26/2018 0847   TRIG 160 (H) 10/26/2018 0847   HDL 51 10/26/2018 0847   CHOLHDL 2.2 10/26/2018 0847   LDLCALC 27 10/26/2018 0847   LDLDIRECT 118 (H) 04/05/2018 4098    He reports excellent compliance with treatment. He is not having side effects.   Wt Readings from Last 3 Encounters:  03/08/19 166 lb (75.3 kg)  10/26/18 169 lb (76.7 kg)  07/20/18 165 lb 9.6 oz (75.1 kg)    ------------------------------------------------------------------------  His primary complaint today is persistent and worsening numbness in left 5th digit, and overall weakness of left hand. Denies any wrist pain or swelling. No know injury, states has had for several months or maybe even a year but is getting worse.   Allergies  Allergen Reactions  . Penicillins      Current Outpatient Medications:  .  allopurinol (ZYLOPRIM) 100 MG tablet, TAKE 1 TABLET BY MOUTH EVERY DAY, Disp: 30 tablet, Rfl: 11 .  amLODipine (NORVASC) 10 MG tablet, TAKE 1 TABLET BY MOUTH EVERY DAY, Disp: 90 tablet, Rfl: 4 .  aspirin EC 81 MG tablet, Take 81 mg by mouth daily., Disp: , Rfl:  .  buPROPion (WELLBUTRIN SR) 150 MG 12 hr tablet, Take 1 tablet (150 mg total) by mouth 2 (two) times daily., Disp: 180 tablet, Rfl: 3 .  glucose blood (ONETOUCH VERIO) test strip, Use as instructed to check blood sugar daily, Disp: 100 each, Rfl: 1 .  metFORMIN (GLUCOPHAGE-XR) 500 MG 24 hr tablet, TAKE 1 TABLET BY MOUTH DAILY WITH BREAKFAST. FOR 2 WEEKS, THEN INCREASE TO 1 TABLET TWICE DAILY (Patient taking differently: Take 500 mg by mouth daily with breakfast. Give w/food.), Disp: 180 tablet, Rfl: 4 .  omeprazole (PRILOSEC) 20 MG capsule, Take 1 capsule (20 mg total) by mouth daily., Disp: 90 capsule, Rfl: 4 .  rosuvastatin (CRESTOR) 10 MG tablet, TAKE 1 TABLET BY MOUTH EVERY DAY, Disp: 90 tablet,  Rfl: 4 .  sildenafil (REVATIO) 20 MG tablet, TAKE 3-5 TABLETS AS NEEDED PRIOR TO INTERCOURSE, NOT TO EXCEED 5 TABLETS IN A DAY, Disp: 30 tablet, Rfl: 5 .  zolpidem (AMBIEN) 10 MG tablet, TAKE 1 TABLET (10 MG TOTAL) BY MOUTH AT BEDTIME AS NEEDED FOR SLEEP., Disp: 20 tablet, Rfl: 5 .  buPROPion (WELLBUTRIN SR) 150 MG 12 hr tablet, Take 1 tablet (150 mg total) by mouth 2 (two) times daily. 1 tablet daily for 3 days, then 1 tablet twice daily. Stop smoking 14 days after starting medication (Patient not taking: Reported on 03/08/2019), Disp: 60 tablet, Rfl: 3  Review of Systems  Constitutional: Positive for fatigue. Negative for activity change, appetite change, chills, diaphoresis, fever and unexpected weight change.  Respiratory: Negative.   Cardiovascular: Positive for leg swelling. Negative for chest pain and palpitations.  Gastrointestinal: Negative.   Endocrine: Negative.   Neurological: Negative for dizziness, light-headedness and headaches.    Social History   Tobacco Use  . Smoking status: Current Every Day Smoker    Packs/day: 1.50    Years: 42.00    Pack years: 63.00    Types: Cigarettes  . Smokeless tobacco: Never Used  . Tobacco comment: smoked 1-2 ppd since age 71  Substance Use Topics  . Alcohol use: Yes    Alcohol/week: 0.0 standard drinks    Comment: drinks 6 beers daily       Objective:   BP (!) 166/85 (BP Location: Right Arm, Patient Position: Sitting, Cuff Size: Normal)   Pulse 85   Temp 98.2 F (36.8 C) (Oral)   Wt 166 lb (75.3 kg)   BMI 26.00 kg/m  Vitals:   03/08/19 0839  BP: (!) 166/85  Pulse: 85  Temp: 98.2 F (36.8 C)  TempSrc: Oral  Weight: 166 lb (75.3 kg)     Physical Exam   General Appearance:    Alert, cooperative, no distress  Eyes:    PERRL, conjunctiva/corneas clear, EOM's intact       Lungs:     Clear to auscultation bilaterally, respirations unlabored  Heart:    Regular rate and rhythm  MS:   Negative Tinel's Phalen's   Neurologic:   Awake, alert, oriented x 3. No apparent focal neurological           defect.       Results for orders placed or performed in visit on 03/08/19  POCT glycosylated hemoglobin (Hb A1C)  Result Value Ref Range   Hemoglobin A1C 7.6 (A) 4.0 - 5.6 %       Assessment & Plan    1. Essential (primary) hypertension Uncontrolled, add- lisinopril (ZESTRIL) 10 MG tablet; Take 1 tablet (10 mg total) by mouth daily.  Dispense: 90 tablet; Refill: 0 Recheck in a month  2. Diabetes mellitus without complication ([place]) Well controlled.  Continue current medications.   - POCT glycosylated hemoglobin (Hb A1C)  3. Hypertriglyceridemia He is tolerating rosuvastatin well with no adverse effects.  Check lipids with next lab work.  4. Numbness and tingling of left 4th and 5th digits. Persistent and worsening for several months, associated with some difficulty using left hand. Possible ulnar tunnel syndrome versus radiculopathy  - Ambulatory referral to Neurology  5. History of adenomatous polyp of colon Have been trying to get back in with GI for several months. Gave him contact phone number at last visit, but he states he didn't make appointment due to Covid-19 pandemic. Will send another referral, patient given phone number to call on [facility].  - Ambulatory referral to Gastroenterology    The entirety of the information documented in the History of Present Illness, Review of Systems and Physical Exam were personally obtained by me. Portions of this information were initially documented by Ashley Royalty, CMA and reviewed by me for thoroughness and accuracy.   Lelon Huh, MD  [facility]

## 2019-04-17 ENCOUNTER — Encounter: Payer: Self-pay | Admitting: *Deleted

## 2019-04-21 ENCOUNTER — Telehealth: Payer: Self-pay | Admitting: Family Medicine

## 2019-04-21 NOTE — Telephone Encounter (Signed)
Please advise patient Omar Watson has been trying to contact him to schedule colonoscopy. Have him call [phone] to schedule.

## 2019-04-22 NOTE — Telephone Encounter (Signed)
LMTCB 04/22/2019   Thanks,   -Mickel Baas

## 2019-04-23 NOTE — Telephone Encounter (Signed)
LMTCB.  Please give pt [facility] number below.    Thanks,   -Mickel Baas

## 2019-04-23 NOTE — Telephone Encounter (Signed)
Pt returned call. Thanks TNP °

## 2019-04-25 ENCOUNTER — Telehealth: Payer: Self-pay

## 2019-04-25 NOTE — Telephone Encounter (Signed)
Patient returned called. Information given for [facility]. Patient states that he already has the number, and will call them back. He has been very busy.

## 2019-04-26 ENCOUNTER — Other Ambulatory Visit: Payer: Self-pay

## 2019-04-26 ENCOUNTER — Ambulatory Visit (INDEPENDENT_AMBULATORY_CARE_PROVIDER_SITE_OTHER): Payer: BC Managed Care – PPO | Admitting: Family Medicine

## 2019-04-26 ENCOUNTER — Encounter: Payer: Self-pay | Admitting: Family Medicine

## 2019-04-26 VITALS — BP 167/77 | HR 82 | Temp 98.4°F | Wt 164.0 lb

## 2019-04-26 DIAGNOSIS — E119 Type 2 diabetes mellitus without complications: Secondary | ICD-10-CM

## 2019-04-26 DIAGNOSIS — I10 Essential (primary) hypertension: Secondary | ICD-10-CM | POA: Diagnosis not present

## 2019-04-26 DIAGNOSIS — Z23 Encounter for immunization: Secondary | ICD-10-CM

## 2019-04-26 DIAGNOSIS — Z125 Encounter for screening for malignant neoplasm of prostate: Secondary | ICD-10-CM

## 2019-04-26 DIAGNOSIS — Z1159 Encounter for screening for other viral diseases: Secondary | ICD-10-CM

## 2019-04-26 DIAGNOSIS — E781 Pure hyperglyceridemia: Secondary | ICD-10-CM

## 2019-04-26 NOTE — Patient Instructions (Addendum)
.   Please review the attached list of medications and notify my office if there are any errors.   . Please bring all of your medications to every appointment so we can make sure that our medication list is the same as yours.   . We will have flu vaccines available after Labor Day. Please go to your pharmacy or call the office in early September to schedule you flu shot.  . Please go to the lab draw station in [address] on the second floor of [facility]  when you are fasting for 8 hours. Normal hours are 8:00am to 12:30pm and 1:30pm to 4:00pm Monday through Friday   Call [facility] at [phone] to schedule a colonoscopy as soon as possible.You had pre-cancerous colon polyps several years ago and are overdue for a colonoscopy to make sure you don't get colon cancer.

## 2019-04-26 NOTE — Progress Notes (Signed)
Patient: Omar Watson Male    DOB: Apr 08, 1956   63 y.o.   MRN: [id number] Visit Date: 04/26/2019  Today's Provider: Lelon Huh, MD   Chief Complaint  Patient presents with  . Hypertension   Subjective:     HPI   Hypertension, follow-up:  BP Readings from Last 3 Encounters:  04/26/19 (!) 167/77  03/08/19 (!) 166/85  10/26/18 (!) 150/70    He was last seen for hypertension 1 months ago.  BP at that visit was 165/85. Management since that visit includes Started Lisinopril 10mg  a day. He reports excellent compliance with treatment. He is having side effects. Pt reports a mild cough and some diarrhea. He is not exercising. He is not adherent to low salt diet.   Outside blood pressures are are not being check. He is experiencing none.  Patient denies chest pain.   Cardiovascular risk factors include diabetes mellitus, hypertension and male gender.  Use of agents associated with hypertension: none.     Weight trend: stable Wt Readings from Last 3 Encounters:  04/26/19 164 lb (74.4 kg)  03/08/19 166 lb (75.3 kg)  10/26/18 169 lb (76.7 kg)    Current diet: in general, an "unhealthy" diet  ------------------------------------------------------------------------     Allergies  Allergen Reactions  . Penicillins      Current Outpatient Medications:  .  allopurinol (ZYLOPRIM) 100 MG tablet, TAKE 1 TABLET BY MOUTH EVERY DAY, Disp: 30 tablet, Rfl: 11 .  amLODipine (NORVASC) 10 MG tablet, TAKE 1 TABLET BY MOUTH EVERY DAY, Disp: 90 tablet, Rfl: 4 .  aspirin EC 81 MG tablet, Take 81 mg by mouth daily., Disp: , Rfl:  .  buPROPion (WELLBUTRIN SR) 150 MG 12 hr tablet, Take 1 tablet (150 mg total) by mouth 2 (two) times daily., Disp: 180 tablet, Rfl: 3 .  glucose blood (ONETOUCH VERIO) test strip, Use as instructed to check blood sugar daily, Disp: 100 each, Rfl: 1 .  lisinopril (ZESTRIL) 10 MG tablet, Take 1 tablet (10 mg total) by mouth daily., Disp: 90  tablet, Rfl: 0 .  metFORMIN (GLUCOPHAGE-XR) 500 MG 24 hr tablet, TAKE 1 TABLET BY MOUTH DAILY WITH BREAKFAST. FOR 2 WEEKS, THEN INCREASE TO 1 TABLET TWICE DAILY (Patient taking differently: Take 500 mg by mouth daily with breakfast. Give w/food.), Disp: 180 tablet, Rfl: 4 .  omeprazole (PRILOSEC) 20 MG capsule, Take 1 capsule (20 mg total) by mouth daily., Disp: 90 capsule, Rfl: 4 .  rosuvastatin (CRESTOR) 10 MG tablet, TAKE 1 TABLET BY MOUTH EVERY DAY, Disp: 90 tablet, Rfl: 4 .  sildenafil (REVATIO) 20 MG tablet, TAKE 3-5 TABLETS AS NEEDED PRIOR TO INTERCOURSE, NOT TO EXCEED 5 TABLETS IN A DAY, Disp: 30 tablet, Rfl: 5 .  zolpidem (AMBIEN) 10 MG tablet, TAKE 1 TABLET (10 MG TOTAL) BY MOUTH AT BEDTIME AS NEEDED FOR SLEEP., Disp: 20 tablet, Rfl: 5 .  buPROPion (WELLBUTRIN SR) 150 MG 12 hr tablet, Take 1 tablet (150 mg total) by mouth 2 (two) times daily. 1 tablet daily for 3 days, then 1 tablet twice daily. Stop smoking 14 days after starting medication (Patient not taking: Reported on 03/08/2019), Disp: 60 tablet, Rfl: 3  Review of Systems  Constitutional: Negative.   Respiratory: Positive for cough. Negative for apnea, choking, chest tightness, shortness of breath, wheezing and stridor.   Cardiovascular: Negative.   Gastrointestinal: Positive for diarrhea. Negative for abdominal distention, abdominal pain, anal bleeding, blood in stool, constipation, nausea, rectal  pain and vomiting.  Musculoskeletal: Negative.   Neurological: Negative for dizziness, light-headedness and headaches.    Social History   Tobacco Use  . Smoking status: Current Every Day Smoker    Packs/day: 1.50    Years: 42.00    Pack years: 63.00    Types: Cigarettes  . Smokeless tobacco: Never Used  . Tobacco comment: smoked 1-2 ppd since age 24  Substance Use Topics  . Alcohol use: Yes    Alcohol/week: 0.0 standard drinks    Comment: drinks 6 beers daily       Objective:   BP (!) 167/77 (BP Location: Right Arm,  Patient Position: Sitting, Cuff Size: Normal)   Pulse 82   Temp 98.4 F (36.9 C) (Oral)   Wt 164 lb (74.4 kg)   BMI 25.69 kg/m  Vitals:   04/26/19 0828  BP: (!) 167/77  Pulse: 82  Temp: 98.4 F (36.9 C)  TempSrc: Oral  Weight: 164 lb (74.4 kg)     Physical Exam   General Appearance:    Alert, cooperative, no distress  Eyes:    PERRL, conjunctiva/corneas clear, EOM's intact       Lungs:     Clear to auscultation bilaterally, respirations unlabored  Heart:    Normal heart rate. Normal rhythm. No murmurs, rubs, or gallops.   Neurologic:   Awake, alert, oriented x 3. No apparent focal neurological           defect.            Assessment & Plan    1. Essential (primary) hypertension Check labs, anticipate increasing lisinopril or adding hctz after reviewing labs.  - CBC  2. Hypertriglyceridemia He is tolerating rosuvastatin well with no adverse effects.   - Lipid panel - Comprehensive metabolic panel - CBC  3. Diabetes mellitus without complication (HCC)  - CBC  4. Prostate cancer screening  - PSA  5. Need for hepatitis C screening test  - Hepatitis C antibody  6. Need for 23-polyvalent pneumococcal polysaccharide vaccine  - Pneumococcal polysaccharide vaccine 23-valent greater than or equal to 2yo subcutaneous/IM  7. Need for shingles vaccine  - Varicella-zoster vaccine IM (Shingrix)  He states he has contact number to schedule colonoscopy and plans on calling soon.     Lelon Huh, MD  [facility]

## 2019-05-03 DIAGNOSIS — E119 Type 2 diabetes mellitus without complications: Secondary | ICD-10-CM | POA: Diagnosis not present

## 2019-05-03 DIAGNOSIS — D539 Nutritional anemia, unspecified: Secondary | ICD-10-CM | POA: Diagnosis not present

## 2019-05-03 DIAGNOSIS — Z1159 Encounter for screening for other viral diseases: Secondary | ICD-10-CM | POA: Diagnosis not present

## 2019-05-03 DIAGNOSIS — E781 Pure hyperglyceridemia: Secondary | ICD-10-CM | POA: Diagnosis not present

## 2019-05-03 DIAGNOSIS — Z125 Encounter for screening for malignant neoplasm of prostate: Secondary | ICD-10-CM | POA: Diagnosis not present

## 2019-05-03 DIAGNOSIS — I10 Essential (primary) hypertension: Secondary | ICD-10-CM | POA: Diagnosis not present

## 2019-05-04 LAB — CBC
Hematocrit: 34.4 % — ABNORMAL LOW (ref 37.5–51.0)
Hemoglobin: 11.7 g/dL — ABNORMAL LOW (ref 13.0–17.7)
MCH: 33.4 pg — ABNORMAL HIGH (ref 26.6–33.0)
MCHC: 34 g/dL (ref 31.5–35.7)
MCV: 98 fL — ABNORMAL HIGH (ref 79–97)
Platelets: 179 10*3/uL (ref 150–450)
RBC: 3.5 x10E6/uL — ABNORMAL LOW (ref 4.14–5.80)
RDW: 13.2 % (ref 11.6–15.4)
WBC: 6.5 10*3/uL (ref 3.4–10.8)

## 2019-05-04 LAB — LIPID PANEL
Chol/HDL Ratio: 1.8 ratio (ref 0.0–5.0)
Cholesterol, Total: 100 mg/dL (ref 100–199)
HDL: 55 mg/dL (ref >39)
LDL Calculated: 20 mg/dL (ref 0–99)
Triglycerides: 124 mg/dL (ref 0–149)
VLDL Cholesterol Cal: 25 mg/dL (ref 5–40)

## 2019-05-04 LAB — COMPREHENSIVE METABOLIC PANEL
ALT: 59 IU/L — ABNORMAL HIGH (ref 0–44)
AST: 38 IU/L (ref 0–40)
Albumin/Globulin Ratio: 2 (ref 1.2–2.2)
Albumin: 4.3 g/dL (ref 3.8–4.9)
Alkaline Phosphatase: 83 IU/L (ref 39–117)
BUN/Creatinine Ratio: 7 — ABNORMAL LOW (ref 10–24)
BUN: 7 mg/dL — ABNORMAL LOW (ref 8–27)
Bilirubin Total: 0.3 mg/dL (ref 0.0–1.2)
CO2: 20 mmol/L (ref 20–29)
Calcium: 9.1 mg/dL (ref 8.6–10.2)
Chloride: 103 mmol/L (ref 96–106)
Creatinine, Ser: 0.96 mg/dL (ref 0.76–1.27)
GFR calc Af Amer: 99 mL/min/{1.73_m2} (ref >59)
GFR calc non Af Amer: 86 mL/min/{1.73_m2} (ref >59)
Globulin, Total: 2.1 g/dL (ref 1.5–4.5)
Glucose: 158 mg/dL — ABNORMAL HIGH (ref 65–99)
Potassium: 3.9 mmol/L (ref 3.5–5.2)
Sodium: 139 mmol/L (ref 134–144)
Total Protein: 6.4 g/dL (ref 6.0–8.5)

## 2019-05-04 LAB — PSA: Prostate Specific Ag, Serum: 0.5 ng/mL (ref 0.0–4.0)

## 2019-05-04 LAB — HEPATITIS C ANTIBODY: Hep C Virus Ab: <0.1 s/co ratio (ref 0.0–0.9)

## 2019-05-08 ENCOUNTER — Other Ambulatory Visit: Payer: Self-pay | Admitting: Family Medicine

## 2019-05-08 ENCOUNTER — Telehealth: Payer: Self-pay

## 2019-05-08 DIAGNOSIS — D519 Vitamin B12 deficiency anemia, unspecified: Secondary | ICD-10-CM | POA: Insufficient documentation

## 2019-05-08 DIAGNOSIS — D649 Anemia, unspecified: Secondary | ICD-10-CM | POA: Insufficient documentation

## 2019-05-08 DIAGNOSIS — D518 Other vitamin B12 deficiency anemias: Secondary | ICD-10-CM

## 2019-05-08 MED ORDER — LISINOPRIL-HYDROCHLOROTHIAZIDE 10-12.5 MG PO TABS: 1.0000 | ORAL_TABLET | Freq: Every day | ORAL | 2 refills | Status: DC

## 2019-05-08 NOTE — Telephone Encounter (Signed)
lmtcb

## 2019-05-08 NOTE — Telephone Encounter (Signed)
-----   Message from Birdie Sons, MD sent at 05/04/2019  9:33 PM EDT ----- Cholesterol is well controlled at 100. Continue current dose of rosuvastatin Is somewhat anemic. Please add B12 and folate levels. Need to change lisinopril to lisinopril-hctz 10/12.5, #90. rf x 0 . Follow up in September as scheduled.

## 2019-05-08 NOTE — Telephone Encounter (Signed)
-----   Message from Birdie Sons, MD sent at 05/08/2019  8:02 AM EDT ----- b12 levels are boderline low, which is probably making him a little anemic. Need to take OTC vitamin B12 1000 units daily. Have sent prescription for lisinopril-hctz to take place of lisinopril for better blood pressure control. Follow up in September as scheduled.

## 2019-05-09 NOTE — Telephone Encounter (Signed)
lmtcb

## 2019-05-13 NOTE — Telephone Encounter (Signed)
LMTCB 05/13/2019  Thanks,   -Laura  

## 2019-05-15 LAB — SPECIMEN STATUS REPORT

## 2019-05-15 LAB — FOLATE: Folate: 5.1 ng/mL (ref >3.0)

## 2019-05-15 LAB — VITAMIN B12: Vitamin B-12: 280 pg/mL (ref 232–1245)

## 2019-05-21 NOTE — Telephone Encounter (Signed)
Tried calling patient. Left message to call back. Letter mailed to patient home since patient has not returned previous calls. Patient has an office visit scheduled on 05/31/2019. Will try advising patient during office visit also.

## 2019-05-31 ENCOUNTER — Ambulatory Visit (INDEPENDENT_AMBULATORY_CARE_PROVIDER_SITE_OTHER): Payer: BC Managed Care – PPO | Admitting: Family Medicine

## 2019-05-31 ENCOUNTER — Encounter: Payer: Self-pay | Admitting: Family Medicine

## 2019-05-31 ENCOUNTER — Other Ambulatory Visit: Payer: Self-pay

## 2019-05-31 VITALS — BP 144/76 | HR 88 | Temp 96.9°F | Resp 20 | Wt 159.0 lb

## 2019-05-31 DIAGNOSIS — I10 Essential (primary) hypertension: Secondary | ICD-10-CM | POA: Diagnosis not present

## 2019-05-31 DIAGNOSIS — D518 Other vitamin B12 deficiency anemias: Secondary | ICD-10-CM

## 2019-05-31 MED ORDER — LISINOPRIL-HYDROCHLOROTHIAZIDE 10-12.5 MG PO TABS: 1.0000 | ORAL_TABLET | Freq: Every day | ORAL | 2 refills | Status: DC

## 2019-05-31 MED ORDER — VITAMIN B-12 1000 MCG PO TABS: 1000.0000 ug | ORAL_TABLET | Freq: Every day | ORAL | Status: DC

## 2019-05-31 NOTE — Progress Notes (Signed)
Patient: Omar Watson Male    DOB: Jun 08, 1956   63 y.o.   MRN: [id number] Visit Date: 05/31/2019  Today's Provider: Lelon Huh, MD   Chief Complaint  Patient presents with  . Hypertension   Subjective:     HPI  Hypertension, follow-up:  BP Readings from Last 3 Encounters:  05/31/19 (!) 144/76  04/26/19 (!) 167/77  03/08/19 (!) 166/85    He was last seen for hypertension 1 months ago.  BP at that visit was 167/77. Management since that visit includes changing Lisinopril to Lisinopril/ HCTZ.Marland Kitchen He reports poor compliance with treatment. Patient was unable to be reached by telephone and did not return our call to be advised of this change. He is not having side effects.  He is not exercising. He is not adherent to low salt diet.   Outside blood pressures are not checked. He is experiencing none.  Patient denies chest pain, chest pressure/discomfort, claudication, dyspnea, exertional chest pressure/discomfort, fatigue, irregular heart beat, lower extremity edema, near-syncope, orthopnea, palpitations, paroxysmal nocturnal dyspnea, syncope and tachypnea.   Cardiovascular risk factors include advanced age (older than 44 for men, 55 for women), hypertension and male gender.  Use of agents associated with hypertension: NSAIDS.     Weight trend: decreasing steadily Wt Readings from Last 3 Encounters:  05/31/19 159 lb (72.1 kg)  04/26/19 164 lb (74.4 kg)  03/08/19 166 lb (75.3 kg)    Current diet: in general, an "unhealthy" diet  ------------------------------------------------------------------------  He was also found to be mildly anemic with hgb = 11.7 and vitamin b12 level of 280. He never returned telephone messages and did not read letter that was sent to him so has not yet started on b12 supplement.   Allergies  Allergen Reactions  . Penicillins      Current Outpatient Medications:  .  allopurinol (ZYLOPRIM) 100 MG tablet, TAKE 1 TABLET BY MOUTH  EVERY DAY, Disp: 30 tablet, Rfl: 11 .  amLODipine (NORVASC) 10 MG tablet, TAKE 1 TABLET BY MOUTH EVERY DAY, Disp: 90 tablet, Rfl: 4 .  aspirin EC 81 MG tablet, Take 81 mg by mouth daily., Disp: , Rfl:  .  glucose blood (ONETOUCH VERIO) test strip, Use as instructed to check blood sugar daily, Disp: 100 each, Rfl: 1 .  metFORMIN (GLUCOPHAGE-XR) 500 MG 24 hr tablet, TAKE 1 TABLET BY MOUTH DAILY WITH BREAKFAST. FOR 2 WEEKS, THEN INCREASE TO 1 TABLET TWICE DAILY (Patient taking differently: Take 500 mg by mouth daily with breakfast. Give w/food.), Disp: 180 tablet, Rfl: 4 .  omeprazole (PRILOSEC) 20 MG capsule, Take 1 capsule (20 mg total) by mouth daily., Disp: 90 capsule, Rfl: 4 .  rosuvastatin (CRESTOR) 10 MG tablet, TAKE 1 TABLET BY MOUTH EVERY DAY, Disp: 90 tablet, Rfl: 4 .  sildenafil (REVATIO) 20 MG tablet, TAKE 3-5 TABLETS AS NEEDED PRIOR TO INTERCOURSE, NOT TO EXCEED 5 TABLETS IN A DAY, Disp: 30 tablet, Rfl: 5 .  zolpidem (AMBIEN) 10 MG tablet, TAKE 1 TABLET (10 MG TOTAL) BY MOUTH AT BEDTIME AS NEEDED FOR SLEEP., Disp: 20 tablet, Rfl: 5 .  buPROPion (WELLBUTRIN SR) 150 MG 12 hr tablet, Take 1 tablet (150 mg total) by mouth 2 (two) times daily. (Patient not taking: Reported on 05/31/2019), Disp: 180 tablet, Rfl: 3 .  lisinopril-hydrochlorothiazide (ZESTORETIC) 10-12.5 MG tablet, Take 1 tablet by mouth daily. (Patient not taking: Reported on 05/31/2019), Disp: 90 tablet, Rfl: 2  Review of Systems  Constitutional: Negative for  appetite change, chills and fever.  Respiratory: Negative for chest tightness, shortness of breath and wheezing.   Cardiovascular: Negative for chest pain and palpitations.  Gastrointestinal: Negative for abdominal pain, nausea and vomiting.  Skin:       Dry scalp  Neurological: Positive for numbness (left pinky finger).    Social History   Tobacco Use  . Smoking status: Current Every Day Smoker    Packs/day: 1.50    Years: 42.00    Pack years: 63.00    Types:  Cigarettes  . Smokeless tobacco: Never Used  . Tobacco comment: smoked 1-2 ppd since age 17  Substance Use Topics  . Alcohol use: Yes    Alcohol/week: 0.0 standard drinks    Comment: drinks 6 beers daily       Objective:   BP (!) 144/76 (BP Location: Right Arm, Cuff Size: Normal)   Pulse 88   Temp (!) 96.9 F (36.1 C) (Temporal)   Resp 20   Wt 159 lb (72.1 kg)   SpO2 98% Comment: room air  BMI 24.90 kg/m  Vitals:   05/31/19 0818 05/31/19 0820  BP: (!) 142/74 (!) 144/76  Pulse: 88   Resp: 20   Temp: (!) 96.9 F (36.1 C)   TempSrc: Temporal   SpO2: 98%   Weight: 159 lb (72.1 kg)   Body mass index is 24.9 kg/m.   Physical Exam   General appearance: alert, well developed, well nourished, cooperative and in no distress Head: Normocephalic, without obvious abnormality, atraumatic Respiratory: Respirations even and unlabored, normal respiratory rate Extremities: All extremities are intact.  Skin: Skin color, texture, turgor normal. No rashes seen  Psych: Appropriate mood and affect. Neurologic: Mental status: Alert, oriented to person, place, and time, thought content appropriate.     Assessment & Plan    1. Essential (primary) hypertension Has not yet picked up prescription for change from lisinopril to lisinopril-hctz. Prescription resent today and he is to start immediately.  - lisinopril-hydrochlorothiazide (ZESTORETIC) 10-12.5 MG tablet; Take 1 tablet by mouth daily. REPLACES LISINOPRIL  Dispense: 90 tablet; Refill: 2  2. Other vitamin B12 deficiency anemia Start OTC- vitamin B-12 (CYANOCOBALAMIN) 1000 MCG tablet; Take 1 tablet (1,000 mcg total) by mouth daily.  Dispense:    Return in about a month for htn, dm, shingles vaccines and flu vaccine.     Lelon Huh, MD  [facility]

## 2019-05-31 NOTE — Patient Instructions (Addendum)
   Call [facility] at [phone] to schedule a colonoscopy as soon as possible. You had pre-cancerous colon polyps several years ago and are overdue   Start taking OTC vitamin B12 1000 mcg dissolvable tablet once a day   Start taking lisinopril-hctz 10/12.5mg  tablet once a day in place of lisinopril. Continue taking amlodipine 10mg  once a day

## 2019-06-02 ENCOUNTER — Other Ambulatory Visit: Payer: Self-pay | Admitting: Family Medicine

## 2019-06-02 DIAGNOSIS — G4700 Insomnia, unspecified: Secondary | ICD-10-CM

## 2019-06-20 ENCOUNTER — Other Ambulatory Visit: Payer: Self-pay | Admitting: Family Medicine

## 2019-06-20 DIAGNOSIS — M1A9XX0 Chronic gout, unspecified, without tophus (tophi): Secondary | ICD-10-CM

## 2019-07-05 ENCOUNTER — Ambulatory Visit: Payer: Self-pay | Admitting: Family Medicine

## 2019-07-12 ENCOUNTER — Other Ambulatory Visit: Payer: Self-pay

## 2019-07-12 ENCOUNTER — Ambulatory Visit: Payer: BC Managed Care – PPO | Admitting: Family Medicine

## 2019-07-12 ENCOUNTER — Encounter: Payer: Self-pay | Admitting: Family Medicine

## 2019-07-12 VITALS — BP 152/82 | HR 68 | Temp 97.7°F | Resp 16 | Wt 162.0 lb

## 2019-07-12 DIAGNOSIS — I10 Essential (primary) hypertension: Secondary | ICD-10-CM | POA: Diagnosis not present

## 2019-07-12 DIAGNOSIS — E119 Type 2 diabetes mellitus without complications: Secondary | ICD-10-CM

## 2019-07-12 DIAGNOSIS — Z23 Encounter for immunization: Secondary | ICD-10-CM | POA: Diagnosis not present

## 2019-07-12 LAB — POCT GLYCOSYLATED HEMOGLOBIN (HGB A1C): Hemoglobin A1C: 7.6 % — AB (ref 4.0–5.6)

## 2019-07-12 MED ORDER — VALSARTAN-HYDROCHLOROTHIAZIDE 80-12.5 MG PO TABS: 1.0000 | ORAL_TABLET | Freq: Every day | ORAL | 1 refills | Status: DC

## 2019-07-12 MED ORDER — METFORMIN HCL ER 500 MG PO TB24: 500.0000 mg | ORAL_TABLET | Freq: Two times a day (BID) | ORAL | Status: DC

## 2019-07-12 NOTE — Patient Instructions (Signed)
   Call [facility] at [phone] to schedule a colonoscopy as soon as possible. You are at high risk of developing colon cancer if you do not keep up to day on colonoscopies.

## 2019-07-12 NOTE — Progress Notes (Signed)
Patient: Omar Watson Male    DOB: [birth year]   63 y.o.   MRN: [id number] Visit Date: 07/12/2019  Today's Provider: Lelon Huh, MD   Chief Complaint  Patient presents with  . Hypertension  . Hyperlipidemia  . Diabetes   Subjective:     HPI  Hypertension, follow-up:  BP Readings from Last 3 Encounters:  07/12/19 (!) 152/82  05/31/19 (!) 144/76  04/26/19 (!) 167/77    He was last seen for hypertension 2 months ago.  BP at that visit was 144/76. Management since that visit includes advising patient to start Lisinopril-HCTZ. He reports poor compliance with treatment. He is having side effects. Patient reports the Lisinopril made him cough, so he stopped taking it. He is not exercising. He is not adherent to low salt diet.   Outside blood pressures are not being checked. He is experiencing none.  Patient denies chest pain, chest pressure/discomfort, claudication, dyspnea, exertional chest pressure/discomfort, fatigue, irregular heart beat, lower extremity edema, near-syncope, orthopnea, palpitations, paroxysmal nocturnal dyspnea, syncope and tachypnea.   Cardiovascular risk factors include advanced age (older than 77 for men, 59 for women), diabetes mellitus, dyslipidemia and hypertension.  Use of agents associated with hypertension: NSAIDS.     Weight trend: fluctuating a bit Wt Readings from Last 3 Encounters:  07/12/19 162 lb (73.5 kg)  05/31/19 159 lb (72.1 kg)  04/26/19 164 lb (74.4 kg)    Current diet: in general, an "unhealthy" diet  ------------------------------------------------------------------------  Diabetes Mellitus Type II, Follow-up:   Lab Results  Component Value Date   HGBA1C 7.6 (A) 07/12/2019   HGBA1C 7.6 (A) 03/08/2019   HGBA1C 7.6 (H) 10/26/2018    Last seen for diabetes 2 months ago.  Management since then includes no changes. He reports fair compliance with treatment. Has been out of metformin for the last three days,  but had only been taking one a day before running out.  He is not having side effects.  Current symptoms include none and have been stable. Home blood sugar records: blood sugars are not checked  Episodes of hypoglycemia? no   Current insulin regiment: Is not on insulin Most Recent Eye Exam: >1 year ago Weight trend: fluctuating a bit Prior visit with dietician: No Current exercise: none Current diet habits: in general, an "unhealthy" diet  Pertinent Labs:    Component Value Date/Time   CHOL 100 05/03/2019 0826   TRIG 124 05/03/2019 0826   HDL 55 05/03/2019 0826   LDLCALC 20 05/03/2019 0826   CREATININE 0.96 05/03/2019 0826    Wt Readings from Last 3 Encounters:  07/12/19 162 lb (73.5 kg)  05/31/19 159 lb (72.1 kg)  04/26/19 164 lb (74.4 kg)    ------------------------------------------------------------------------  Allergies  Allergen Reactions  . Penicillins      Current Outpatient Medications:  .  allopurinol (ZYLOPRIM) 100 MG tablet, TAKE 1 TABLET BY MOUTH EVERY DAY, Disp: 90 tablet, Rfl: 3 .  amLODipine (NORVASC) 10 MG tablet, TAKE 1 TABLET BY MOUTH EVERY DAY, Disp: 90 tablet, Rfl: 4 .  aspirin EC 81 MG tablet, Take 81 mg by mouth daily., Disp: , Rfl:  .  glucose blood (ONETOUCH VERIO) test strip, Use as instructed to check blood sugar daily, Disp: 100 each, Rfl: 1 .  omeprazole (PRILOSEC) 20 MG capsule, TAKE 1 CAPSULE BY MOUTH EVERY DAY, Disp: 90 capsule, Rfl: 4 .  rosuvastatin (CRESTOR) 10 MG tablet, TAKE 1 TABLET BY MOUTH EVERY DAY, Disp: 90  tablet, Rfl: 4 .  sildenafil (REVATIO) 20 MG tablet, TAKE 3-5 TABLETS AS NEEDED PRIOR TO INTERCOURSE, NOT TO EXCEED 5 TABLETS IN A DAY, Disp: 30 tablet, Rfl: 5 .  vitamin B-12 (CYANOCOBALAMIN) 1000 MCG tablet, Take 1 tablet (1,000 mcg total) by mouth daily., Disp:  , Rfl:  .  zolpidem (AMBIEN) 10 MG tablet, TAKE 1 TABLET (10 MG TOTAL) BY MOUTH AT BEDTIME AS NEEDED FOR SLEEP., Disp: 20 tablet, Rfl: 5 .  buPROPion  (WELLBUTRIN SR) 150 MG 12 hr tablet, Take 1 tablet (150 mg total) by mouth 2 (two) times daily. (Patient not taking: Reported on 05/31/2019), Disp: 180 tablet, Rfl: 3 .  lisinopril-hydrochlorothiazide (ZESTORETIC) 10-12.5 MG tablet, Take 1 tablet by mouth daily. REPLACES LISINOPRIL (Patient not taking: Reported on 07/12/2019), Disp: 90 tablet, Rfl: 2 .  metFORMIN (GLUCOPHAGE-XR) 500 MG 24 hr tablet, TAKE 1 TABLET BY MOUTH DAILY WITH BREAKFAST. FOR 2 WEEKS, THEN INCREASE TO 1 TABLET TWICE DAILY (Patient not taking, ran out 3 days ago, was only taking one a day: Give w/food.), Disp: 180 tablet, Rfl: 4  Review of Systems  Constitutional: Negative for appetite change, chills and fever.  Respiratory: Negative for chest tightness, shortness of breath and wheezing.   Cardiovascular: Negative for chest pain and palpitations.  Gastrointestinal: Negative for abdominal pain, nausea and vomiting.    Social History   Tobacco Use  . Smoking status: Current Every Day Smoker    Packs/day: 1.50    Years: 42.00    Pack years: 63.00    Types: Cigarettes  . Smokeless tobacco: Never Used  . Tobacco comment: smoked 1-2 ppd since age 42  Substance Use Topics  . Alcohol use: Yes    Alcohol/week: 0.0 standard drinks    Comment: drinks 6 beers daily       Objective:   BP (!) 152/82 (BP Location: Left Arm, Cuff Size: Normal)   Pulse 68   Temp 97.7 F (36.5 C) (Temporal)   Resp 16   Wt 162 lb (73.5 kg)   SpO2 99% Comment: room air  BMI 25.37 kg/m  Vitals:   07/12/19 0821 07/12/19 0825  BP: (!) 142/78 (!) 152/82  Pulse: 68   Resp: 16   Temp: 97.7 F (36.5 C)   TempSrc: Temporal   SpO2: 99%   Weight: 162 lb (73.5 kg)   Body mass index is 25.37 kg/m.   Physical Exam  General appearance: Obese male, cooperative and in no acute distress Head: Normocephalic, without obvious abnormality, atraumatic Respiratory: Respirations even and unlabored, normal respiratory rate Extremities: All extremities  are intact.  Skin: Skin color, texture, turgor normal. No rashes seen  Psych: Appropriate mood and affect. Neurologic: Mental status: Alert, oriented to person, place, and time, thought content appropriate.  Results for orders placed or performed in visit on 07/12/19  POCT HgB A1C  Result Value Ref Range   Hemoglobin A1C 7.6 (A) 4.0 - 5.6 %   HbA1c POC (<> result, manual entry)         Assessment & Plan    1. Controlled type 2 diabetes mellitus without complication, without long-term current use of insulin ([place]) Has only been taking one metformin, advised to go ahead and increase to 2, he states he has prescription ready to pick up at pharmacy.   2. Essential (primary) hypertension Intolerant to lisinopril due to cough, change to - valsartan-hydrochlorothiazide (DIOVAN HCT) 80-12.5 MG tablet; Take 1 tablet by mouth daily.  Dispense: 30 tablet; Refill: 1  Will call in 3 weeks and check renal if tolerating.   3. Need for shingles vaccine  - Varicella-zoster vaccine IM  4. Need for influenza vaccination  - Flu Vaccine QUAD 6+ mos PF IM (Fluarix Quad PF)  5. Tdap today  Reminded patient again that he needs to call [facility] to schedule colonoscopy.   The entirety of the information documented in the History of Present Illness, Review of Systems and Physical Exam were personally obtained by me. Portions of this information were initially documented by Meyer Cory, CMA and reviewed by me for thoroughness and accuracy.      Lelon Huh, MD  [facility]

## 2019-07-26 ENCOUNTER — Other Ambulatory Visit: Payer: Self-pay | Admitting: Family Medicine

## 2019-07-26 DIAGNOSIS — I10 Essential (primary) hypertension: Secondary | ICD-10-CM

## 2019-08-04 ENCOUNTER — Telehealth: Payer: Self-pay | Admitting: Family Medicine

## 2019-08-04 DIAGNOSIS — I10 Essential (primary) hypertension: Secondary | ICD-10-CM

## 2019-08-04 NOTE — Telephone Encounter (Signed)
Patient was changed from lisinopril to valsartan last month due to side effects. Please see if tolerating, and advise needs to check renal panel to see if new medication is affecting sodium  Or potassium levels.

## 2019-08-05 NOTE — Telephone Encounter (Signed)
Attempted to contact patient, no answer and voicemail is full. If patient agrees to have labs done please route back to office so lab slip can be printed.

## 2019-08-09 NOTE — Telephone Encounter (Signed)
Patient states he is tolerating Valsartan well. Lab slip printed at the front desk. Patient will stop by to have labs done soon.

## 2019-10-25 ENCOUNTER — Other Ambulatory Visit: Payer: Self-pay | Admitting: Family Medicine

## 2019-11-05 ENCOUNTER — Other Ambulatory Visit: Payer: Self-pay | Admitting: Family Medicine

## 2019-11-14 NOTE — Progress Notes (Signed)
Patient: Omar Watson Male    DOB: December 27, 1955   65 y.o.   MRN: [id number] Visit Date: 11/15/2019  Today's Provider: Lelon Huh, MD   Chief Complaint  Patient presents with  . Hypertension  . Diabetes   Subjective:     HPI  Hypertension, follow-up:     BP Readings from Last 3 Encounters:  07/12/19 (!) 152/82  05/31/19 (!) 144/76  04/26/19 (!) 167/77    He was last seen for hypertension 4 months ago.  BP at that visit was 152/82. Management since that visit includes changed to - valsartan-hydrochlorothiazide (DIOVAN HCT) 80-12.5 MG tablet due to cough He reports good compliance with treatment, although he thinks he may have run out of amlodipine and pharmacy hasn't refilled.  He is having side effects.  He is not exercising. He is not adherent to low salt diet.   Outside blood pressures are not being checked. He is experiencing none.  Patient denies chest pain, chest pressure/discomfort, claudication, dyspnea, exertional chest pressure/discomfort, fatigue, irregular heart beat, lower extremity edema, near-syncope, orthopnea, palpitations, paroxysmal nocturnal dyspnea, syncope and tachypnea.   Cardiovascular risk factors include advanced age (older than 57 for men, 23 for women), diabetes mellitus, dyslipidemia and hypertension.  Use of agents associated with hypertension: NSAIDS.                Weight trend: fluctuating a bit    Wt Readings from Last 3 Encounters:  07/12/19 162 lb (73.5 kg)  05/31/19 159 lb (72.1 kg)  04/26/19 164 lb (74.4 kg)    Current diet: in general, an "unhealthy" diet  ------------------------------------------------------------------------  Diabetes Mellitus Type II, Follow-up:   Recent Labs       Lab Results  Component Value Date   HGBA1C 7.6 (A) 07/12/2019   HGBA1C 7.6 (A) 03/08/2019   HGBA1C 7.6 (H) 10/26/2018      Last seen for diabetes 4 months ago.  Management since then includes advised to take 2  tablets of Metformin daily He reports good compliance with treatment.  He is not having side effects.  Current symptoms include none  Home blood sugar records: blood sugars are not checked  Episodes of hypoglycemia? no              Current insulin regiment: Is not on insulin Most Recent Eye Exam: >1 year ago Weight trend: fluctuating a bit Prior visit with dietician: No Current exercise: none Current diet habits: in general, an "unhealthy" diet  Pertinent Labs: Labs (Brief)          Component Value Date/Time   CHOL 100 05/03/2019 0826   TRIG 124 05/03/2019 0826   HDL 55 05/03/2019 0826   LDLCALC 20 05/03/2019 0826   CREATININE 0.96 05/03/2019 0826         Wt Readings from Last 3 Encounters:  07/12/19 162 lb (73.5 kg)  05/31/19 159 lb (72.1 kg)  04/26/19 164 lb (74.4 kg)    ------------------------------------------------------------------------  Allergies  Allergen Reactions  . Lisinopril Cough  . Penicillins      Current Outpatient Medications:  .  allopurinol (ZYLOPRIM) 100 MG tablet, TAKE 1 TABLET BY MOUTH EVERY DAY, Disp: 90 tablet, Rfl: 3 .  amLODipine (NORVASC) 10 MG tablet, TAKE 1 TABLET BY MOUTH EVERY DAY, Disp: 90 tablet, Rfl: 4 (NOT TAKING) .  aspirin EC 81 MG tablet, Take 81 mg by mouth daily., Disp: , Rfl:  .  glucose blood (ONETOUCH VERIO) test strip, Use  as instructed to check blood sugar daily, Disp: 100 each, Rfl: 1 .  metFORMIN (GLUCOPHAGE-XR) 500 MG 24 hr tablet, TAKE 1 TABLET BY MOUTH DAILY WITH BREAKFAST. FOR 2 WEEKS, THEN INCREASE TO 1 TABLET TWICE DAILY, Disp: 180 tablet, Rfl: 0 .  omeprazole (PRILOSEC) 20 MG capsule, TAKE 1 CAPSULE BY MOUTH EVERY DAY, Disp: 90 capsule, Rfl: 4 .  rosuvastatin (CRESTOR) 10 MG tablet, TAKE 1 TABLET BY MOUTH EVERY DAY, Disp: 90 tablet, Rfl: 4 .  sildenafil (REVATIO) 20 MG tablet, TAKE 3-5 TABLETS AS NEEDED PRIOR TO INTERCOURSE, NOT TO EXCEED 5 TABLETS IN A DAY, Disp: 90 tablet, Rfl: 0 .   valsartan-hydrochlorothiazide (DIOVAN-HCT) 80-12.5 MG tablet, TAKE 1 TABLET BY MOUTH EVERY DAY, Disp: 90 tablet, Rfl: 1 .  vitamin B-12 (CYANOCOBALAMIN) 1000 MCG tablet, Take 1 tablet (1,000 mcg total) by mouth daily., Disp:  , Rfl:  .  zolpidem (AMBIEN) 10 MG tablet, TAKE 1 TABLET (10 MG TOTAL) BY MOUTH AT BEDTIME AS NEEDED FOR SLEEP., Disp: 20 tablet, Rfl: 5 .  buPROPion (WELLBUTRIN SR) 150 MG 12 hr tablet, Take 1 tablet (150 mg total) by mouth 2 (two) times daily. (Patient not taking: Reported on 05/31/2019), Disp: 180 tablet, Rfl: 3  Review of Systems  Constitutional: Negative.   Respiratory: Negative.   Cardiovascular: Negative.   Endocrine: Negative.   Musculoskeletal: Negative.     Social History   Tobacco Use  . Smoking status: Current Every Day Smoker    Packs/day: 1.50    Years: 42.00    Pack years: 63.00    Types: Cigarettes  . Smokeless tobacco: Never Used  . Tobacco comment: smoked 1-2 ppd since age 24  Substance Use Topics  . Alcohol use: Yes    Alcohol/week: 0.0 standard drinks    Comment: drinks 6 beers daily       Objective:   BP (!) 179/83 (BP Location: Right Arm, Patient Position: Sitting, Cuff Size: Normal)   Pulse 80   Temp (!) 96.8 F (36 C) (Temporal)   Ht 5\' 7"  (1.702 m)   Wt 164 lb (74.4 kg)   BMI 25.69 kg/m  Vitals:   11/15/19 0828  BP: (!) 179/83  Pulse: 80  Temp: (!) 96.8 F (36 C)  TempSrc: Temporal  Weight: 164 lb (74.4 kg)  Height: 5\' 7"  (1.702 m)  Body mass index is 25.69 kg/m.   Physical Exam   General: Appearance:     Overweight male in no acute distress  Eyes:    PERRL, conjunctiva/corneas clear, EOM's intact       Lungs:     Clear to auscultation bilaterally, respirations unlabored  Heart:    Normal heart rate. Normal rhythm. No murmurs, rubs, or gallops.   MS:   All extremities are intact.   Neurologic:   Awake, alert, oriented x 3. No apparent focal neurological           defect.        Results for orders placed or  performed in visit on 11/15/19  POCT HgB A1C  Result Value Ref Range   Hemoglobin A1C 7.5 (A) 4.0 - 5.6 %   Est. average glucose Bld gHb Est-mCnc 169        Assessment & Plan    1. Type 2 diabetes mellitus with other specified complication, without long-term current use of insulin (HCC) Complicated by dyslipidemia and hypertension. Good glycemic control  - Ambulatory referral to Ophthalmology  2. Hypertriglyceridemia Doing well on resouvastatin.  3. Essential (primary) hypertension Uncontrolled today, he did not bring his medications today, but states he is only taking one BP medication. Dispense history is consistent with him running out of amlodipine in January. Refill sent to pharmacy.   - Ambulatory referral to Ophthalmology - amLODipine (NORVASC) 10 MG tablet; Take 1 tablet (10 mg total) by mouth daily.  Dispense: 90 tablet; Refill: 4  4. History of adenomatous polyp of colon  - Ambulatory referral to gastroenterology for colonoscopy  Follow up 3 months.     Lelon Huh, MD  [facility]

## 2019-11-15 ENCOUNTER — Other Ambulatory Visit: Payer: Self-pay

## 2019-11-15 ENCOUNTER — Ambulatory Visit (INDEPENDENT_AMBULATORY_CARE_PROVIDER_SITE_OTHER): Payer: BC Managed Care – PPO | Admitting: Family Medicine

## 2019-11-15 ENCOUNTER — Encounter: Payer: Self-pay | Admitting: Family Medicine

## 2019-11-15 VITALS — BP 179/83 | HR 80 | Temp 96.8°F | Ht 67.0 in | Wt 164.0 lb

## 2019-11-15 DIAGNOSIS — E781 Pure hyperglyceridemia: Secondary | ICD-10-CM

## 2019-11-15 DIAGNOSIS — E1169 Type 2 diabetes mellitus with other specified complication: Secondary | ICD-10-CM | POA: Diagnosis not present

## 2019-11-15 DIAGNOSIS — I10 Essential (primary) hypertension: Secondary | ICD-10-CM

## 2019-11-15 DIAGNOSIS — Z86010 Personal history of colonic polyps: Secondary | ICD-10-CM | POA: Diagnosis not present

## 2019-11-15 LAB — POCT GLYCOSYLATED HEMOGLOBIN (HGB A1C)
Est. average glucose Bld gHb Est-mCnc: 169
Hemoglobin A1C: 7.5 % — AB (ref 4.0–5.6)

## 2019-11-15 MED ORDER — AMLODIPINE BESYLATE 10 MG PO TABS: 10.0000 mg | ORAL_TABLET | Freq: Every day | ORAL | 4 refills | Status: DC

## 2019-11-15 NOTE — Patient Instructions (Addendum)
   Call [facility] at [phone] to schedule a colonoscopy as soon as possible. You are at high risk of developing colon cancer if you do not keep up to day on colonoscopies.   Make sure you are taking amlodipine 10mg  every day or your blood pressure

## 2020-02-12 NOTE — Progress Notes (Signed)
Established patient visit   Patient: Omar Watson   DOB: 11-10-1955   64 y.o. Male  MRN: [id number] Visit Date: 02/14/2020  Today's healthcare provider: Lelon Huh, MD   Chief Complaint  Patient presents with  . Diabetes  . Hyperlipidemia  . Hypertension   Subjective    HPI Diabetes Mellitus Type II, Follow-up  Lab Results  Component Value Date   HGBA1C 7.5 (A) 11/15/2019   HGBA1C 7.6 (A) 07/12/2019   HGBA1C 7.6 (A) 03/08/2019   Wt Readings from Last 3 Encounters:  02/14/20 161 lb (73 kg)  11/15/19 164 lb (74.4 kg)  07/12/19 162 lb (73.5 kg)   Last seen for diabetes 3 months ago.  Management since then includes continue same medication. He reports good compliance with treatment. He is not having side effects.  Symptoms: No fatigue No foot ulcerations  No appetite changes No nausea  No paresthesia of the feet  No polydipsia  No polyuria No visual disturbances   No vomiting     Home blood sugar records: blood sugars are not checked  Episodes of hypoglycemia? No    Current insulin regiment: not on insulin Most Recent Eye Exam: not UTD Current exercise: none Current diet habits: in general, an "unhealthy" diet  Pertinent Labs: Lab Results  Component Value Date   CHOL 100 05/03/2019   HDL 55 05/03/2019   LDLCALC 20 05/03/2019   LDLDIRECT 118 (H) 04/05/2018   TRIG 124 05/03/2019   CHOLHDL 1.8 05/03/2019   Lab Results  Component Value Date   NA 139 05/03/2019   K 3.9 05/03/2019   CREATININE 0.96 05/03/2019   GFRNONAA 86 05/03/2019   GFRAA 99 05/03/2019   GLUCOSE 158 (H) 05/03/2019     --------------------------------------------------------------------------------------------------- Lipid/Cholesterol, Follow-up  Last lipid panel Other pertinent labs  Lab Results  Component Value Date   CHOL 100 05/03/2019   HDL 55 05/03/2019   LDLCALC 20 05/03/2019   LDLDIRECT 118 (H) 04/05/2018   TRIG 124 05/03/2019   CHOLHDL 1.8 05/03/2019    Lab Results  Component Value Date   ALT 59 (H) 05/03/2019   AST 38 05/03/2019   PLT 179 05/03/2019   TSH 3.460 04/05/2018     He was last seen for this 3 months ago.  Management since that visit includes continuing same medication.  He reports poor compliance with treatment.He has not been taking Crestor for about 2-3 months. He is not having side effects.   Symptoms: No chest pain No chest pressure/discomfort  No dyspnea No lower extremity edema  No numbness or tingling of extremity No orthopnea  No palpitations No paroxysmal nocturnal dyspnea  No speech difficulty No syncope   Current diet: in general, an "unhealthy" diet Current exercise: none  The ASCVD Risk score (Custar., et al., 2013) failed to calculate for the following reasons:   The valid total cholesterol range is 130 to 320 mg/dL  --------------------------------------------------------------------------------------------------- Hypertension, follow-up  BP Readings from Last 3 Encounters:  02/14/20 (!) 158/82  11/15/19 (!) 179/83  07/12/19 (!) 152/82   Wt Readings from Last 3 Encounters:  02/14/20 161 lb (73 kg)  11/15/19 164 lb (74.4 kg)  07/12/19 162 lb (73.5 kg)     He was last seen for hypertension 3 months ago.  BP at that visit was 179/83. Management since that visit includes starting back on medication; refills sent into pharmacy.   He reports poor compliance with treatment. He has  run out of valsartan-hctz He is not having side effects.  He is following a Regular diet. He is not exercising. He does smoke. He did not fill bupropion prescription to help with smoking cessation.   Use of agents associated with hypertension: NSAIDS.   Outside blood pressures are not checked. Symptoms: No chest pain No chest pressure  No palpitations No syncope  No dyspnea No orthopnea  No paroxysmal nocturnal dyspnea No lower extremity edema   Pertinent labs: Lab Results  Component Value Date   CHOL  100 05/03/2019   HDL 55 05/03/2019   LDLCALC 20 05/03/2019   LDLDIRECT 118 (H) 04/05/2018   TRIG 124 05/03/2019   CHOLHDL 1.8 05/03/2019   Lab Results  Component Value Date   NA 139 05/03/2019   K 3.9 05/03/2019   CREATININE 0.96 05/03/2019   GFRNONAA 86 05/03/2019   GFRAA 99 05/03/2019   GLUCOSE 158 (H) 05/03/2019     The ASCVD Risk score (Goff DC Jr., et al., 2013) failed to calculate for the following reasons:   The valid total cholesterol range is 130 to 320 mg/dL   ---------------------------------------------------------------------------------------------------  He also has ulcerated lesion right vertex of scalp, about 7-12mm dm, which he states has been present for over a year.   He also had several mildly pruritic large vesicular lesions across dorsum of right forearm which he states just popped up a few days ago. They do not sting or burn. Is slightly erythematous along track of lesions.    Medications: Outpatient Medications Prior to Visit  Medication Sig  . allopurinol (ZYLOPRIM) 100 MG tablet TAKE 1 TABLET BY MOUTH EVERY DAY  . amLODipine (NORVASC) 10 MG tablet Take 1 tablet (10 mg total) by mouth daily.  . metFORMIN (GLUCOPHAGE-XR) 500 MG 24 hr tablet TAKE 1 TABLET BY MOUTH DAILY WITH BREAKFAST. FOR 2 WEEKS, THEN INCREASE TO 1 TABLET TWICE DAILY  . omeprazole (PRILOSEC) 20 MG capsule TAKE 1 CAPSULE BY MOUTH EVERY DAY  . aspirin EC 81 MG tablet Take 81 mg by mouth daily.  Marland Kitchen buPROPion (WELLBUTRIN SR) 150 MG 12 hr tablet Take 1 tablet (150 mg total) by mouth 2 (two) times daily. (Patient not taking: Reported on 05/31/2019)  . glucose blood (ONETOUCH VERIO) test strip Use as instructed to check blood sugar daily (Patient not taking: Reported on 02/14/2020)  . rosuvastatin (CRESTOR) 10 MG tablet TAKE 1 TABLET BY MOUTH EVERY DAY (Patient not taking: Reported on 02/14/2020)  . sildenafil (REVATIO) 20 MG tablet TAKE 3-5 TABLETS AS NEEDED PRIOR TO INTERCOURSE, NOT TO EXCEED 5  TABLETS IN A DAY (Patient not taking: Reported on 02/14/2020)  . valsartan-hydrochlorothiazide (DIOVAN-HCT) 80-12.5 MG tablet TAKE 1 TABLET BY MOUTH EVERY DAY (Patient not taking: Reported on 02/14/2020)  . vitamin B-12 (CYANOCOBALAMIN) 1000 MCG tablet Take 1 tablet (1,000 mcg total) by mouth daily. (Patient not taking: Reported on 02/14/2020)  . zolpidem (AMBIEN) 10 MG tablet TAKE 1 TABLET (10 MG TOTAL) BY MOUTH AT BEDTIME AS NEEDED FOR SLEEP. (Patient not taking: Reported on 02/14/2020)   No facility-administered medications prior to visit.    Review of Systems  Constitutional: Negative for appetite change, chills and fever.  Respiratory: Negative for chest tightness, shortness of breath and wheezing.   Cardiovascular: Negative for chest pain and palpitations.  Gastrointestinal: Negative for abdominal pain, nausea and vomiting.  Skin: Positive for rash (blisters on arms and scalp).     Objective    BP (!) 158/82 (BP Location: Right Arm,  Cuff Size: Normal)   Pulse 83   Temp (!) 97.3 F (36.3 C) (Temporal)   Resp 18   Wt 161 lb (73 kg)   SpO2 97% Comment: room air  BMI 25.22 kg/m  Physical Exam   General: Appearance:     Overweight male in no acute distress  Eyes:    PERRL, conjunctiva/corneas clear, EOM's intact       Lungs:     Clear to auscultation bilaterally, respirations unlabored  Heart:    Normal heart rate. Normal rhythm. No murmurs, rubs, or gallops.   MS:   All extremities are intact.   Neurologic:   Awake, alert, oriented x 3. No apparent focal neurological           defect.         Results for orders placed or performed in visit on 02/14/20  POCT HgB A1C  Result Value Ref Range   Hemoglobin A1C 7.9 (A) 4.0 - 5.6 %   Est. average glucose Bld gHb Est-mCnc 180     Assessment & Plan     1. Type 2 diabetes mellitus with other specified complication, without long-term current use of insulin (HCC) A1c is up, but he states he usually only take metformin once daily.  Advised to start taking consistently twice a day.   Follow up 3-4 months.   2. Essential (primary) hypertension Uncontrolled, he is out of - valsartan-hydrochlorothiazide (DIOVAN-HCT) 80-12.5 MG tablet; Take 1 tablet by mouth daily.  Dispense: 90 tablet; Refill: 4 Refilled today  3. Scalp lesion He reports this sore has been there for at least a year and never heals up. Suspicious of AK or SCC - Ambulatory referral to Dermatology  4. Allergic contact dermatitis, unspecified trigger Right forearm.  - triamcinolone cream (KENALOG) 0.1 %; Apply 1 application topically 2 (two) times daily. To forearm  Dispense: 30 g; Refill: 0  5. Smoking greater than 30 pack years Not compliant with bupropion and not interested in trying another medication. Encouraged him to keep trying to stop smoking.   6. History of adenomatous polyp of colon Has been repeatedly counseled on high risk of colon cancer and importance of scheduling follow up colonoscopy. Counseled once  Again today and given contact information to contact [facility] where referral has already been placed.    No follow-ups on file.      The entirety of the information documented in the History of Present Illness, Review of Systems and Physical Exam were personally obtained by me. Portions of this information were initially documented by the CMA and reviewed by me for thoroughness and accuracy.      Lelon Huh, MD  [facility] [phone] (phone) [phone] (fax)  [place]

## 2020-02-14 ENCOUNTER — Ambulatory Visit: Payer: BC Managed Care – PPO | Admitting: Family Medicine

## 2020-02-14 ENCOUNTER — Other Ambulatory Visit: Payer: Self-pay

## 2020-02-14 ENCOUNTER — Encounter: Payer: Self-pay | Admitting: Family Medicine

## 2020-02-14 VITALS — BP 158/82 | HR 83 | Temp 97.3°F | Resp 18 | Wt 161.0 lb

## 2020-02-14 DIAGNOSIS — L239 Allergic contact dermatitis, unspecified cause: Secondary | ICD-10-CM | POA: Diagnosis not present

## 2020-02-14 DIAGNOSIS — F17210 Nicotine dependence, cigarettes, uncomplicated: Secondary | ICD-10-CM

## 2020-02-14 DIAGNOSIS — Z86010 Personal history of colonic polyps: Secondary | ICD-10-CM

## 2020-02-14 DIAGNOSIS — L989 Disorder of the skin and subcutaneous tissue, unspecified: Secondary | ICD-10-CM | POA: Diagnosis not present

## 2020-02-14 DIAGNOSIS — E1169 Type 2 diabetes mellitus with other specified complication: Secondary | ICD-10-CM

## 2020-02-14 DIAGNOSIS — Z860101 Personal history of adenomatous and serrated colon polyps: Secondary | ICD-10-CM

## 2020-02-14 DIAGNOSIS — I10 Essential (primary) hypertension: Secondary | ICD-10-CM | POA: Diagnosis not present

## 2020-02-14 LAB — POCT GLYCOSYLATED HEMOGLOBIN (HGB A1C)
Est. average glucose Bld gHb Est-mCnc: 180
Hemoglobin A1C: 7.9 % — AB (ref 4.0–5.6)

## 2020-02-14 MED ORDER — TRIAMCINOLONE ACETONIDE 0.1 % EX CREA: 1.0000 "application " | TOPICAL_CREAM | Freq: Two times a day (BID) | CUTANEOUS | 0 refills | Status: DC

## 2020-02-14 MED ORDER — VALSARTAN-HYDROCHLOROTHIAZIDE 80-12.5 MG PO TABS: 1.0000 | ORAL_TABLET | Freq: Every day | ORAL | 4 refills | Status: DC

## 2020-02-14 NOTE — Patient Instructions (Addendum)
   Call [facility] at [phone] to schedule a colonoscopy as soon as possible. Otherwise you will probably die from colon cancer

## 2020-04-08 ENCOUNTER — Other Ambulatory Visit: Payer: Self-pay | Admitting: Family Medicine

## 2020-04-08 NOTE — Telephone Encounter (Signed)
Requested Prescriptions  Pending Prescriptions Disp Refills  . metFORMIN (GLUCOPHAGE-XR) 500 MG 24 hr tablet [Pharmacy Med Name: METFORMIN HCL ER 500 MG TABLET] 180 tablet 0    Sig: TAKE 1 TABLET BY MOUTH DAILY WITH BREAKFAST. FOR 2 WEEKS, THEN INCREASE TO 1 TABLET TWICE DAILY     Endocrinology:  Diabetes - Biguanides Passed - 04/08/2020  1:23 AM      Passed - Cr in normal range and within 360 days    Creatinine, Ser  Date Value Ref Range Status  05/03/2019 0.96 0.76 - 1.27 mg/dL Final         Passed - HBA1C is between 0 and 7.9 and within 180 days    Hemoglobin A1C  Date Value Ref Range Status  02/14/2020 7.9 (A) 4.0 - 5.6 % Final   Hgb A1c MFr Bld  Date Value Ref Range Status  10/26/2018 7.6 (H) 4.8 - 5.6 % Final    Comment:             Prediabetes: 5.7 - 6.4          Diabetes: >6.4          Glycemic control for adults with diabetes: <7.0          Passed - eGFR in normal range and within 360 days    GFR calc Af Amer  Date Value Ref Range Status  05/03/2019 99 >59 mL/min/1.73 Final   GFR calc non Af Amer  Date Value Ref Range Status  05/03/2019 86 >59 mL/min/1.73 Final         Passed - Valid encounter within last 6 months    Recent Outpatient Visits          1 month ago Type 2 diabetes mellitus with other specified complication, without long-term current use of insulin ([place])   [facility] Birdie Sons, MD   4 months ago History of adenomatous polyp of colon   [facility] Birdie Sons, MD   9 months ago Controlled type 2 diabetes mellitus without complication, without long-term current use of insulin [facility])   [facility] Birdie Sons, MD   10 months ago Essential (primary) hypertension   [facility] Birdie Sons, MD   11 months ago Need for hepatitis C screening test   [facility] Birdie Sons, MD      Future Appointments            In 1 month Ralene Bathe, MD  [place]   In 1 month Fisher, Kirstie Peri, MD [facility] [place]

## 2020-05-21 ENCOUNTER — Other Ambulatory Visit: Payer: Self-pay | Admitting: Dermatology

## 2020-05-21 ENCOUNTER — Other Ambulatory Visit: Payer: Self-pay

## 2020-05-21 ENCOUNTER — Ambulatory Visit: Payer: BC Managed Care – PPO | Admitting: Dermatology

## 2020-05-21 DIAGNOSIS — L739 Follicular disorder, unspecified: Secondary | ICD-10-CM | POA: Diagnosis not present

## 2020-05-21 DIAGNOSIS — D485 Neoplasm of uncertain behavior of skin: Secondary | ICD-10-CM | POA: Diagnosis not present

## 2020-05-21 DIAGNOSIS — B079 Viral wart, unspecified: Secondary | ICD-10-CM | POA: Diagnosis not present

## 2020-05-21 NOTE — Progress Notes (Signed)
   New Patient Visit  Subjective  Omar Watson is a 64 y.o. male who presents for the following: Bumps (in the scalp - for about 8 months, tender to the touch) and lesion (L hand - growing larger in size but not painful - patient's L 5th digit is numb all the time too which is around where lesion is located).  The following portions of the chart were reviewed this encounter and updated as appropriate:  Tobacco  Allergies  Meds  Problems  Med Hx  Surg Hx  Fam Hx     Review of Systems:  No other skin or systemic complaints except as noted in HPI or Assessment and Plan.  Objective  Well appearing patient in no apparent distress; mood and affect are within normal limits.  A focused examination was performed including the scalp and hands. Relevant physical exam findings are noted in the Assessment and Plan.  Objective  L dorsum hand at the little finger MCP: Hyperkeratotic papule 1.1 cm     Objective  Scalp: Pink scaly papules scattered over the scalp.  Assessment & Plan  Neoplasm of uncertain behavior of skin L dorsum hand at the little finger MCP  Epidermal / dermal shaving  Lesion diameter (cm):  1.1 Informed consent: discussed and consent obtained   Timeout: patient name, date of birth, surgical site, and procedure verified   Procedure prep:  Patient was prepped and draped in usual sterile fashion Prep type:  Isopropyl alcohol Anesthesia: the lesion was anesthetized in a standard fashion   Anesthetic:  1% lidocaine w/ epinephrine 1-100,000 buffered w/ 8.4% NaHCO3 Instrument used: flexible razor blade   Hemostasis achieved with: pressure, aluminum chloride and electrodesiccation   Outcome: patient tolerated procedure well   Post-procedure details: sterile dressing applied and wound care instructions given   Dressing type: bandage and petrolatum    Specimen 1 - Surgical pathology Differential Diagnosis: D48.5 callus vs prurigo nodule r/o CA Check Margins:  No Hyperkeratotic papule 1.1 cm  Callus vs prurigo nodule r/o CA  Folliculitis Scalp  Start Clindamycin lotion apply to aa's QHS wash out in the morning.   Return in about 2 months (around 07/21/2020).   Luther Redo, CMA, am acting as scribe for Sarina Ser, MD .  Documentation: I have reviewed the above documentation for accuracy and completeness, and I agree with the above.  Sarina Ser, MD

## 2020-05-21 NOTE — Patient Instructions (Signed)

## 2020-05-26 ENCOUNTER — Other Ambulatory Visit: Payer: Self-pay

## 2020-05-26 MED ORDER — CLINDAMYCIN PHOSPHATE 1 % EX LOTN: TOPICAL_LOTION | CUTANEOUS | 2 refills | Status: AC

## 2020-05-27 ENCOUNTER — Encounter: Payer: Self-pay | Admitting: Dermatology

## 2020-05-28 ENCOUNTER — Telehealth: Payer: Self-pay

## 2020-05-28 NOTE — Progress Notes (Deleted)
Established patient visit   Patient: Omar Watson   DOB: [birth year]   64 y.o. Male  MRN: [id number] Visit Date: 05/29/2020  Today's healthcare provider: Lelon Huh, MD   No chief complaint on file.  Subjective    HPI  Diabetes Mellitus Type II, Follow-up  Lab Results  Component Value Date   HGBA1C 7.9 (A) 02/14/2020   HGBA1C 7.5 (A) 11/15/2019   HGBA1C 7.6 (A) 07/12/2019   Wt Readings from Last 3 Encounters:  02/14/20 161 lb (73 kg)  11/15/19 164 lb (74.4 kg)  07/12/19 162 lb (73.5 kg)   Last seen for diabetes 3 months ago.  Management since then includes advising to start taking Metformin consistently twice a day.  He reports {excellent/good/fair/poor:19665} compliance with treatment. He {is/is not:21021397} having side effects. {document side effects if present:1}  Home blood sugar records: {diabetes glucometry results:16657}  Episodes of hypoglycemia? {Yes/No:20286} {enter symptoms and frequency of symptoms if yes:1}   Current insulin regiment: not on insulin Most Recent Eye Exam: not UTD {Current exercise:16438:::1} {Current diet habits:16563:::1}  Pertinent Labs: Lab Results  Component Value Date   CHOL 100 05/03/2019   HDL 55 05/03/2019   LDLCALC 20 05/03/2019   LDLDIRECT 118 (H) 04/05/2018   TRIG 124 05/03/2019   CHOLHDL 1.8 05/03/2019   Lab Results  Component Value Date   NA 139 05/03/2019   K 3.9 05/03/2019   CREATININE 0.96 05/03/2019   GFRNONAA 86 05/03/2019   GFRAA 99 05/03/2019   GLUCOSE 158 (H) 05/03/2019     --------------------------------------------------------------------------------------------------- Hypertension, follow-up  BP Readings from Last 3 Encounters:  02/14/20 (!) 158/82  11/15/19 (!) 179/83  07/12/19 (!) 152/82   Wt Readings from Last 3 Encounters:  02/14/20 161 lb (73 kg)  11/15/19 164 lb (74.4 kg)  07/12/19 162 lb (73.5 kg)     He was last seen for hypertension 3 months ago.  BP at that visit was  158/82. Management since that visit includes no changes, was out of medication at the time.  He reports {excellent/good/fair/poor:19665} compliance with treatment. He {is/is not:9024} having side effects. {document side effects if present:1} He is following a {diet:21022986} diet. He {is/is not:9024} exercising. He {does/does not:200015} smoke.  Use of agents associated with hypertension: NSAIDS.   Outside blood pressures are {***enter patient reported home BP readings, or 'not being checked':1}.  Pertinent labs: Lab Results  Component Value Date   CHOL 100 05/03/2019   HDL 55 05/03/2019   LDLCALC 20 05/03/2019   LDLDIRECT 118 (H) 04/05/2018   TRIG 124 05/03/2019   CHOLHDL 1.8 05/03/2019   Lab Results  Component Value Date   NA 139 05/03/2019   K 3.9 05/03/2019   CREATININE 0.96 05/03/2019   GFRNONAA 86 05/03/2019   GFRAA 99 05/03/2019   GLUCOSE 158 (H) 05/03/2019     The ASCVD Risk score (Goff DC Jr., et al., 2013) failed to calculate for the following reasons:   The valid total cholesterol range is 130 to 320 mg/dL   ---------------------------------------------------------------------------------------------------  {Show patient history (optional):23778::" "}   Medications: Outpatient Medications Prior to Visit  Medication Sig  . allopurinol (ZYLOPRIM) 100 MG tablet TAKE 1 TABLET BY MOUTH EVERY DAY  . amLODipine (NORVASC) 10 MG tablet Take 1 tablet (10 mg total) by mouth daily.  Marland Kitchen aspirin EC 81 MG tablet Take 81 mg by mouth daily. (Patient not taking: Reported on 05/21/2020)  . clindamycin (CLEOCIN-T) 1 % lotion Apply topically as directed. To  the affected areas at night and wash out in the AM  . glucose blood (ONETOUCH VERIO) test strip Use as instructed to check blood sugar daily (Patient not taking: Reported on 02/14/2020)  . metFORMIN (GLUCOPHAGE-XR) 500 MG 24 hr tablet TAKE 1 TABLET BY MOUTH DAILY WITH BREAKFAST. FOR 2 WEEKS, THEN INCREASE TO 1 TABLET TWICE DAILY    . omeprazole (PRILOSEC) 20 MG capsule TAKE 1 CAPSULE BY MOUTH EVERY DAY  . rosuvastatin (CRESTOR) 10 MG tablet TAKE 1 TABLET BY MOUTH EVERY DAY  . sildenafil (REVATIO) 20 MG tablet TAKE 3-5 TABLETS AS NEEDED PRIOR TO INTERCOURSE, NOT TO EXCEED 5 TABLETS IN A DAY (Patient not taking: Reported on 02/14/2020)  . triamcinolone cream (KENALOG) 0.1 % Apply 1 application topically 2 (two) times daily. To forearm (Patient not taking: Reported on 05/21/2020)  . valsartan-hydrochlorothiazide (DIOVAN-HCT) 80-12.5 MG tablet Take 1 tablet by mouth daily. (Patient not taking: Reported on 05/21/2020)  . vitamin B-12 (CYANOCOBALAMIN) 1000 MCG tablet Take 1 tablet (1,000 mcg total) by mouth daily. (Patient not taking: Reported on 02/14/2020)  . zolpidem (AMBIEN) 10 MG tablet TAKE 1 TABLET (10 MG TOTAL) BY MOUTH AT BEDTIME AS NEEDED FOR SLEEP. (Patient not taking: Reported on 02/14/2020)   No facility-administered medications prior to visit.    Review of Systems  {Heme  Chem  Endocrine  Serology  Results Review (optional):23779::" "}  Objective    There were no vitals taken for this visit. {Show previous vital signs (optional):23777::" "}  Physical Exam  ***  No results found for any visits on 05/29/20.  Assessment & Plan     ***  No follow-ups on file.      {provider attestation***:1}   Lelon Huh, MD  [facility] [phone] (phone) [phone] (fax)  [place]

## 2020-05-28 NOTE — Telephone Encounter (Signed)
LM on VM to return my call. 

## 2020-05-28 NOTE — Telephone Encounter (Signed)
-----   Message from Ralene Bathe, MD sent at 05/28/2020  2:15 PM EDT ----- Skin , left dorsum hand at the little finger MCP VERRUCA VULGARIS, IRRITATED, BASE INVOLVED  Benign viral wart May recur. Return for further treatment if recurs

## 2020-05-29 ENCOUNTER — Ambulatory Visit: Payer: Self-pay | Admitting: Family Medicine

## 2020-06-01 ENCOUNTER — Telehealth: Payer: Self-pay

## 2020-06-01 NOTE — Telephone Encounter (Signed)
Patient informed of results.  

## 2020-06-01 NOTE — Telephone Encounter (Signed)
-----   Message from Ralene Bathe, MD sent at 05/28/2020  2:15 PM EDT ----- Skin , left dorsum hand at the little finger MCP VERRUCA VULGARIS, IRRITATED, BASE INVOLVED  Benign viral wart May recur. Return for further treatment if recurs

## 2020-06-09 ENCOUNTER — Other Ambulatory Visit: Payer: Self-pay | Admitting: Family Medicine

## 2020-07-29 ENCOUNTER — Ambulatory Visit: Payer: BC Managed Care – PPO | Admitting: Dermatology

## 2020-08-06 ENCOUNTER — Other Ambulatory Visit: Payer: Self-pay | Admitting: Family Medicine

## 2020-08-10 ENCOUNTER — Other Ambulatory Visit: Payer: Self-pay | Admitting: Family Medicine

## 2020-08-10 DIAGNOSIS — M1A9XX0 Chronic gout, unspecified, without tophus (tophi): Secondary | ICD-10-CM

## 2020-09-01 ENCOUNTER — Other Ambulatory Visit: Payer: Self-pay | Admitting: Family Medicine

## 2020-09-05 ENCOUNTER — Other Ambulatory Visit: Payer: Self-pay | Admitting: Family Medicine

## 2020-12-06 ENCOUNTER — Other Ambulatory Visit: Payer: Self-pay | Admitting: Family Medicine

## 2020-12-06 DIAGNOSIS — M1A9XX0 Chronic gout, unspecified, without tophus (tophi): Secondary | ICD-10-CM

## 2020-12-06 NOTE — Telephone Encounter (Signed)
Requested medication (s) are due for refill today: yes  Requested medication (s) are on the active medication list: yes  Last refill:  08/10/20  Future visit scheduled: no  Notes to clinic:  overdue lab work   Requested Prescriptions  Pending Prescriptions Disp Refills   allopurinol (ZYLOPRIM) 100 MG tablet [Pharmacy Med Name: ALLOPURINOL 100 MG TABLET] 90 tablet 0    Sig: TAKE 1 TABLET BY [place]      Endocrinology:  Gout Agents Failed - 12/06/2020 12:46 AM      Failed - Uric Acid in normal range and within 360 days    No results found for: POCURA, LABURIC        Failed - Cr in normal range and within 360 days    Creatinine, Ser  Date Value Ref Range Status  05/03/2019 0.96 0.76 - 1.27 mg/dL Final          Passed - Valid encounter within last 12 months    Recent Outpatient Visits           9 months ago Type 2 diabetes mellitus with other specified complication, without long-term current use of insulin ([place])   [facility] Birdie Sons, MD   1 year ago History of adenomatous polyp of colon   [facility] Birdie Sons, MD   1 year ago Controlled type 2 diabetes mellitus without complication, without long-term current use of insulin [facility])   [facility] Birdie Sons, MD   1 year ago Essential (primary) hypertension   [facility] Birdie Sons, MD   1 year ago Need for hepatitis C screening test   [facility] Birdie Sons, MD

## 2020-12-10 ENCOUNTER — Other Ambulatory Visit: Payer: Self-pay | Admitting: Family Medicine

## 2020-12-10 DIAGNOSIS — I10 Essential (primary) hypertension: Secondary | ICD-10-CM

## 2020-12-10 NOTE — Telephone Encounter (Signed)
Requested medications are due for refill today.  yes  Requested medications are on the active medications list.  yes  Last refill. 11/15/2019  Future visit scheduled.   no  Notes to clinic.  More than 3 months over due for OV.

## 2020-12-21 ENCOUNTER — Other Ambulatory Visit: Payer: Self-pay | Admitting: Family Medicine

## 2020-12-21 DIAGNOSIS — M1A9XX0 Chronic gout, unspecified, without tophus (tophi): Secondary | ICD-10-CM

## 2020-12-21 NOTE — Telephone Encounter (Signed)
Requested medications are due for refill today.  yes  Requested medications are on the active medications list.  yes  Last refill.12/07/2020  Future visit scheduled.   no  Notes to clinic. Courtesy refill already given.

## 2020-12-22 NOTE — Telephone Encounter (Signed)
Refill request for Allopurinol, #90 request; med last filled 12/07/20, #30 with pharmacy note pt needs appt; attempted to contact pt; message states mailbox is full and can not accept messages; refill declined.

## 2020-12-22 NOTE — Telephone Encounter (Signed)
According to last refill on 12/07/20 Dr. Caryn Section instructed that patient needed to schedule office visit/follow up. Attempted to call patient to let him know he needs to schedule visit before refill but there is no voicemail box set up. KW

## 2021-03-09 ENCOUNTER — Other Ambulatory Visit: Payer: Self-pay | Admitting: Family Medicine

## 2021-03-09 DIAGNOSIS — I10 Essential (primary) hypertension: Secondary | ICD-10-CM

## 2021-03-09 NOTE — Telephone Encounter (Signed)
   Notes to clinic:  script has expired Patient also overdue for appt Several attempts have been made to schedule with no success    Requested Prescriptions  Pending Prescriptions Disp Refills   omeprazole (PRILOSEC) 20 MG capsule [Pharmacy Med Name: OMEPRAZOLE DR 20 MG CAPSULE] 90 capsule 0    Sig: TAKE 1 CAPSULE BY MOUTH EVERY DAY      Gastroenterology: Proton Pump Inhibitors Failed - 03/09/2021  1:04 PM      Failed - Valid encounter within last 12 months    Recent Outpatient Visits           1 year ago Type 2 diabetes mellitus with other specified complication, without long-term current use of insulin ([place])   [facility] Birdie Sons, MD   1 year ago History of adenomatous polyp of colon   [facility] Birdie Sons, MD   1 year ago Controlled type 2 diabetes mellitus without complication, without long-term current use of insulin ([place])   [facility] Birdie Sons, MD   1 year ago Essential (primary) hypertension   [place], Kirstie Peri, MD   1 year ago Need for hepatitis C screening test   [facility] Birdie Sons, MD                  amLODipine (NORVASC) 10 MG tablet [Pharmacy Med Name: AMLODIPINE BESYLATE 10 MG TAB] 90 tablet 4    Sig: TAKE 1 TABLET BY MOUTH EVERY DAY      Cardiovascular:  Calcium Channel Blockers Failed - 03/09/2021  1:04 PM      Failed - Last BP in normal range    BP Readings from Last 1 Encounters:  02/14/20 (!) 158/82          Failed - Valid encounter within last 6 months    Recent Outpatient Visits           1 year ago Type 2 diabetes mellitus with other specified complication, without long-term current use of insulin ([place])   [facility] Birdie Sons, MD   1 year ago History of adenomatous polyp of colon   [facility] Birdie Sons, MD   1 year ago Controlled type 2 diabetes mellitus without complication,  without long-term current use of insulin [facility])   [facility] Birdie Sons, MD   1 year ago Essential (primary) hypertension   [facility] Birdie Sons, MD   1 year ago Need for hepatitis C screening test   [facility] Birdie Sons, MD

## 2021-03-23 ENCOUNTER — Other Ambulatory Visit: Payer: Self-pay | Admitting: Family Medicine

## 2021-03-23 DIAGNOSIS — M1A9XX0 Chronic gout, unspecified, without tophus (tophi): Secondary | ICD-10-CM

## 2021-03-24 ENCOUNTER — Other Ambulatory Visit: Payer: Self-pay | Admitting: Family Medicine

## 2021-03-24 DIAGNOSIS — I10 Essential (primary) hypertension: Secondary | ICD-10-CM

## 2021-03-24 NOTE — Telephone Encounter (Signed)
  Notes to clinic:  REQUESTING 9 DAY SUPPLY   Requested Prescriptions  Pending Prescriptions Disp Refills   amLODipine (NORVASC) 10 MG tablet [Pharmacy Med Name: AMLODIPINE BESYLATE 10 MG TAB] 90 tablet 1    Sig: TAKE 1 TABLET BY MOUTH EVERY DAY      Cardiovascular:  Calcium Channel Blockers Failed - 03/24/2021 10:26 AM      Failed - Last BP in normal range    BP Readings from Last 1 Encounters:  02/14/20 (!) 158/82          Failed - Valid encounter within last 6 months    Recent Outpatient Visits           1 year ago Type 2 diabetes mellitus with other specified complication, without long-term current use of insulin ([place])   [facility] Birdie Sons, MD   1 year ago History of adenomatous polyp of colon   [facility] Birdie Sons, MD   1 year ago Controlled type 2 diabetes mellitus without complication, without long-term current use of insulin [facility])   [facility] Birdie Sons, MD   1 year ago Essential (primary) hypertension   [facility] Birdie Sons, MD   1 year ago Need for hepatitis C screening test   [facility] Birdie Sons, MD

## 2021-08-01 ENCOUNTER — Other Ambulatory Visit: Payer: Self-pay | Admitting: Family Medicine

## 2021-08-01 NOTE — Telephone Encounter (Signed)
Requested medication (s) are due for refill today: yes  Requested medication (s) are on the active medication list: yes  Last refill: 09/01/2020 #90/0RF  Future visit scheduled: no  Notes to clinic:  Unable to refill per protocol, appointment needed.      Requested Prescriptions  Pending Prescriptions Disp Refills   sildenafil (REVATIO) 20 MG tablet [Pharmacy Med Name: SILDENAFIL 20 MG TABLET] 90 tablet 0    Sig: TAKE 3-5 TABLETS BY MOUTH AS NEEDED PRIOR TO INTERCOURSE, NOT TO EXCEED 5 TABLETS IN A DAY     Urology: Erectile Dysfunction Agents Failed - 08/01/2021  8:30 AM      Failed - Last BP in normal range    BP Readings from Last 1 Encounters:  02/14/20 (!) 158/82          Failed - Valid encounter within last 12 months    Recent Outpatient Visits           1 year ago Type 2 diabetes mellitus with other specified complication, without long-term current use of insulin ([place])   [facility] Birdie Sons, MD   1 year ago History of adenomatous polyp of colon   [facility] Birdie Sons, MD   2 years ago Controlled type 2 diabetes mellitus without complication, without long-term current use of insulin [facility])   [facility] Birdie Sons, MD   2 years ago Essential (primary) hypertension   [facility] Birdie Sons, MD   2 years ago Need for hepatitis C screening test   [facility] Birdie Sons, MD

## 2023-09-21 ENCOUNTER — Inpatient Hospital Stay
Admission: EM | Admit: 2023-09-21 | Discharge: 2023-09-23 | DRG: 917 | Disposition: A | Discharge status: Home / Self Care | Payer: Medicare Other | Attending: Internal Medicine | Admitting: Internal Medicine

## 2023-09-21 ENCOUNTER — Other Ambulatory Visit: Payer: Self-pay

## 2023-09-21 ENCOUNTER — Emergency Department: Payer: Medicare Other

## 2023-09-21 DIAGNOSIS — M109 Gout, unspecified: Secondary | ICD-10-CM | POA: Diagnosis present

## 2023-09-21 DIAGNOSIS — Z716 Tobacco abuse counseling: Secondary | ICD-10-CM

## 2023-09-21 DIAGNOSIS — Z91141 Patient's other noncompliance with medication regimen due to financial hardship: Secondary | ICD-10-CM

## 2023-09-21 DIAGNOSIS — D649 Anemia, unspecified: Principal | ICD-10-CM | POA: Diagnosis present

## 2023-09-21 DIAGNOSIS — E781 Pure hyperglyceridemia: Secondary | ICD-10-CM | POA: Diagnosis present

## 2023-09-21 DIAGNOSIS — E119 Type 2 diabetes mellitus without complications: Secondary | ICD-10-CM

## 2023-09-21 DIAGNOSIS — R202 Paresthesia of skin: Secondary | ICD-10-CM

## 2023-09-21 DIAGNOSIS — T383X6A Underdosing of insulin and oral hypoglycemic [antidiabetic] drugs, initial encounter: Secondary | ICD-10-CM | POA: Diagnosis present

## 2023-09-21 DIAGNOSIS — Z888 Allergy status to other drugs, medicaments and biological substances status: Secondary | ICD-10-CM

## 2023-09-21 DIAGNOSIS — Z820 Family history of epilepsy and other diseases of the nervous system: Secondary | ICD-10-CM

## 2023-09-21 DIAGNOSIS — G629 Polyneuropathy, unspecified: Secondary | ICD-10-CM

## 2023-09-21 DIAGNOSIS — K922 Gastrointestinal hemorrhage, unspecified: Secondary | ICD-10-CM | POA: Diagnosis present

## 2023-09-21 DIAGNOSIS — Z860101 Personal history of adenomatous and serrated colon polyps: Secondary | ICD-10-CM

## 2023-09-21 DIAGNOSIS — E785 Hyperlipidemia, unspecified: Secondary | ICD-10-CM | POA: Diagnosis present

## 2023-09-21 DIAGNOSIS — E871 Hypo-osmolality and hyponatremia: Secondary | ICD-10-CM | POA: Diagnosis present

## 2023-09-21 DIAGNOSIS — G8929 Other chronic pain: Secondary | ICD-10-CM | POA: Diagnosis present

## 2023-09-21 DIAGNOSIS — T39311A Poisoning by propionic acid derivatives, accidental (unintentional), initial encounter: Principal | ICD-10-CM | POA: Diagnosis present

## 2023-09-21 DIAGNOSIS — K2901 Acute gastritis with bleeding: Secondary | ICD-10-CM | POA: Diagnosis present

## 2023-09-21 DIAGNOSIS — F1010 Alcohol abuse, uncomplicated: Secondary | ICD-10-CM | POA: Diagnosis present

## 2023-09-21 DIAGNOSIS — F17210 Nicotine dependence, cigarettes, uncomplicated: Secondary | ICD-10-CM | POA: Diagnosis present

## 2023-09-21 DIAGNOSIS — K2100 Gastro-esophageal reflux disease with esophagitis, without bleeding: Secondary | ICD-10-CM | POA: Diagnosis present

## 2023-09-21 DIAGNOSIS — N179 Acute kidney failure, unspecified: Secondary | ICD-10-CM | POA: Diagnosis present

## 2023-09-21 DIAGNOSIS — Z8619 Personal history of other infectious and parasitic diseases: Secondary | ICD-10-CM

## 2023-09-21 DIAGNOSIS — Z1152 Encounter for screening for COVID-19: Secondary | ICD-10-CM

## 2023-09-21 DIAGNOSIS — Z720 Tobacco use: Secondary | ICD-10-CM | POA: Diagnosis present

## 2023-09-21 DIAGNOSIS — K921 Melena: Secondary | ICD-10-CM | POA: Diagnosis not present

## 2023-09-21 DIAGNOSIS — E1140 Type 2 diabetes mellitus with diabetic neuropathy, unspecified: Secondary | ICD-10-CM | POA: Diagnosis present

## 2023-09-21 DIAGNOSIS — E860 Dehydration: Secondary | ICD-10-CM | POA: Diagnosis present

## 2023-09-21 DIAGNOSIS — T461X6A Underdosing of calcium-channel blockers, initial encounter: Secondary | ICD-10-CM | POA: Diagnosis present

## 2023-09-21 DIAGNOSIS — Z880 Allergy status to penicillin: Secondary | ICD-10-CM

## 2023-09-21 DIAGNOSIS — Z79899 Other long term (current) drug therapy: Secondary | ICD-10-CM

## 2023-09-21 DIAGNOSIS — K3189 Other diseases of stomach and duodenum: Secondary | ICD-10-CM | POA: Diagnosis present

## 2023-09-21 DIAGNOSIS — Z7982 Long term (current) use of aspirin: Secondary | ICD-10-CM

## 2023-09-21 DIAGNOSIS — M549 Dorsalgia, unspecified: Secondary | ICD-10-CM | POA: Diagnosis present

## 2023-09-21 DIAGNOSIS — R911 Solitary pulmonary nodule: Secondary | ICD-10-CM | POA: Diagnosis present

## 2023-09-21 DIAGNOSIS — K259 Gastric ulcer, unspecified as acute or chronic, without hemorrhage or perforation: Secondary | ICD-10-CM | POA: Diagnosis present

## 2023-09-21 DIAGNOSIS — I10 Essential (primary) hypertension: Secondary | ICD-10-CM | POA: Diagnosis present

## 2023-09-21 DIAGNOSIS — D62 Acute posthemorrhagic anemia: Secondary | ICD-10-CM | POA: Diagnosis present

## 2023-09-21 DIAGNOSIS — Z5971 Insufficient health insurance coverage: Secondary | ICD-10-CM

## 2023-09-21 DIAGNOSIS — K219 Gastro-esophageal reflux disease without esophagitis: Secondary | ICD-10-CM | POA: Diagnosis present

## 2023-09-21 DIAGNOSIS — Z8249 Family history of ischemic heart disease and other diseases of the circulatory system: Secondary | ICD-10-CM

## 2023-09-21 LAB — BASIC METABOLIC PANEL
Anion gap: 9 (ref 5–15)
BUN: 17 mg/dL (ref 8–23)
CO2: 20 mmol/L — ABNORMAL LOW (ref 22–32)
Calcium: 7.8 mg/dL — ABNORMAL LOW (ref 8.9–10.3)
Chloride: 104 mmol/L (ref 98–111)
Creatinine, Ser: 1.18 mg/dL (ref 0.61–1.24)
GFR, Estimated: >60 mL/min (ref >60)
Glucose, Bld: 124 mg/dL — ABNORMAL HIGH (ref 70–99)
Potassium: 4.1 mmol/L (ref 3.5–5.1)
Sodium: 133 mmol/L — ABNORMAL LOW (ref 135–145)

## 2023-09-21 LAB — LIPASE, BLOOD: Lipase: 36 U/L (ref 11–51)

## 2023-09-21 LAB — CBC WITH DIFFERENTIAL/PLATELET
Abs Immature Granulocytes: 0.11 10*3/uL — ABNORMAL HIGH (ref 0.00–0.07)
Basophils Absolute: 0 10*3/uL (ref 0.0–0.1)
Basophils Relative: 0 %
Eosinophils Absolute: 0.2 10*3/uL (ref 0.0–0.5)
Eosinophils Relative: 2 %
HCT: 22 % — ABNORMAL LOW (ref 39.0–52.0)
Hemoglobin: 7.2 g/dL — ABNORMAL LOW (ref 13.0–17.0)
Immature Granulocytes: 1 %
Lymphocytes Relative: 12 %
Lymphs Abs: 1.2 10*3/uL (ref 0.7–4.0)
MCH: 34.4 pg — ABNORMAL HIGH (ref 26.0–34.0)
MCHC: 32.7 g/dL (ref 30.0–36.0)
MCV: 105.3 fL — ABNORMAL HIGH (ref 80.0–100.0)
Monocytes Absolute: 0.5 10*3/uL (ref 0.1–1.0)
Monocytes Relative: 5 %
Neutro Abs: 7.8 10*3/uL — ABNORMAL HIGH (ref 1.7–7.7)
Neutrophils Relative %: 80 %
Platelets: 304 10*3/uL (ref 150–400)
RBC: 2.09 MIL/uL — ABNORMAL LOW (ref 4.22–5.81)
RDW: 15.4 % (ref 11.5–15.5)
WBC: 9.8 10*3/uL (ref 4.0–10.5)
nRBC: 0 % (ref 0.0–0.2)

## 2023-09-21 LAB — CBC
HCT: 19.7 % — ABNORMAL LOW (ref 39.0–52.0)
HCT: 19.7 % — ABNORMAL LOW (ref 39.0–52.0)
Hemoglobin: 6.4 g/dL — ABNORMAL LOW (ref 13.0–17.0)
Hemoglobin: 6.4 g/dL — ABNORMAL LOW (ref 13.0–17.0)
MCH: 33.5 pg (ref 26.0–34.0)
MCH: 33.5 pg (ref 26.0–34.0)
MCHC: 32.5 g/dL (ref 30.0–36.0)
MCHC: 32.5 g/dL (ref 30.0–36.0)
MCV: 103.1 fL — ABNORMAL HIGH (ref 80.0–100.0)
MCV: 103.1 fL — ABNORMAL HIGH (ref 80.0–100.0)
Platelets: 252 10*3/uL (ref 150–400)
Platelets: 261 10*3/uL (ref 150–400)
RBC: 1.91 MIL/uL — ABNORMAL LOW (ref 4.22–5.81)
RBC: 1.91 MIL/uL — ABNORMAL LOW (ref 4.22–5.81)
RDW: 15.2 % (ref 11.5–15.5)
RDW: 15.3 % (ref 11.5–15.5)
WBC: 7.7 10*3/uL (ref 4.0–10.5)
WBC: 7.7 10*3/uL (ref 4.0–10.5)
nRBC: 0 % (ref 0.0–0.2)
nRBC: 0 % (ref 0.0–0.2)

## 2023-09-21 LAB — COMPREHENSIVE METABOLIC PANEL
ALT: 23 U/L (ref 0–44)
AST: 22 U/L (ref 15–41)
Albumin: 3.7 g/dL (ref 3.5–5.0)
Alkaline Phosphatase: 58 U/L (ref 38–126)
Anion gap: 14 (ref 5–15)
BUN: 16 mg/dL (ref 8–23)
CO2: 15 mmol/L — ABNORMAL LOW (ref 22–32)
Calcium: 8 mg/dL — ABNORMAL LOW (ref 8.9–10.3)
Chloride: 100 mmol/L (ref 98–111)
Creatinine, Ser: 1.32 mg/dL — ABNORMAL HIGH (ref 0.61–1.24)
GFR, Estimated: 60 mL/min — ABNORMAL LOW (ref >60)
Glucose, Bld: 189 mg/dL — ABNORMAL HIGH (ref 70–99)
Potassium: 4 mmol/L (ref 3.5–5.1)
Sodium: 129 mmol/L — ABNORMAL LOW (ref 135–145)
Total Bilirubin: 0.5 mg/dL (ref 0.0–1.2)
Total Protein: 6.6 g/dL (ref 6.5–8.1)

## 2023-09-21 LAB — RETICULOCYTES
Immature Retic Fract: 35.8 % — ABNORMAL HIGH (ref 2.3–15.9)
RBC.: 1.89 MIL/uL — ABNORMAL LOW (ref 4.22–5.81)
Retic Count, Absolute: 219.8 10*3/uL — ABNORMAL HIGH (ref 19.0–186.0)
Retic Ct Pct: 11.6 % — ABNORMAL HIGH (ref 0.4–3.1)

## 2023-09-21 LAB — RESP PANEL BY RT-PCR (RSV, FLU A&B, COVID)  RVPGX2
Influenza A by PCR: NEGATIVE
Influenza B by PCR: NEGATIVE
Resp Syncytial Virus by PCR: NEGATIVE
SARS Coronavirus 2 by RT PCR: NEGATIVE

## 2023-09-21 LAB — ETHANOL: Alcohol, Ethyl (B): <10 mg/dL (ref <10)

## 2023-09-21 LAB — CBG MONITORING, ED: Glucose-Capillary: 122 mg/dL — ABNORMAL HIGH (ref 70–99)

## 2023-09-21 LAB — APTT: aPTT: 28 s (ref 24–36)

## 2023-09-21 LAB — PHOSPHORUS: Phosphorus: 2.2 mg/dL — ABNORMAL LOW (ref 2.5–4.6)

## 2023-09-21 LAB — MAGNESIUM: Magnesium: 2.5 mg/dL — ABNORMAL HIGH (ref 1.7–2.4)

## 2023-09-21 LAB — PROTIME-INR
INR: 1 (ref 0.8–1.2)
Prothrombin Time: 13 s (ref 11.4–15.2)

## 2023-09-21 MED ORDER — THIAMINE HCL 100 MG/ML IJ SOLN
100.0000 mg | Freq: Every day | INTRAMUSCULAR | Status: DC
  Administered 2023-09-21 – 2023-09-22 (×2): 100 mg via INTRAVENOUS
  Filled 2023-09-21 (×2): qty 2

## 2023-09-21 MED ORDER — ADULT MULTIVITAMIN W/MINERALS CH
1.0000 | ORAL_TABLET | Freq: Every day | ORAL | Status: DC
  Administered 2023-09-21 – 2023-09-23 (×2): 1 via ORAL
  Filled 2023-09-21 (×3): qty 1

## 2023-09-21 MED ORDER — ACETAMINOPHEN 325 MG PO TABS
650.0000 mg | ORAL_TABLET | Freq: Four times a day (QID) | ORAL | Status: DC | PRN
  Administered 2023-09-23: 650 mg via ORAL
  Filled 2023-09-21: qty 2

## 2023-09-21 MED ORDER — LORAZEPAM 1 MG PO TABS: 1.0000 mg | ORAL_TABLET | ORAL | Status: DC | PRN

## 2023-09-21 MED ORDER — POTASSIUM PHOSPHATES 15 MMOLE/5ML IV SOLN
15.0000 mmol | Freq: Once | INTRAVENOUS | Status: AC
  Administered 2023-09-21: 15 mmol via INTRAVENOUS
  Filled 2023-09-21: qty 5

## 2023-09-21 MED ORDER — SODIUM CHLORIDE 0.9 % IV BOLUS
1000.0000 mL | Freq: Once | INTRAVENOUS | Status: AC
  Administered 2023-09-21: 1000 mL via INTRAVENOUS

## 2023-09-21 MED ORDER — ZOLPIDEM TARTRATE 5 MG PO TABS: 5.0000 mg | ORAL_TABLET | Freq: Every evening | ORAL | Status: DC | PRN

## 2023-09-21 MED ORDER — LORAZEPAM 2 MG/ML IJ SOLN
0.0000 mg | Freq: Four times a day (QID) | INTRAMUSCULAR | Status: DC
  Administered 2023-09-23: 2 mg via INTRAVENOUS
  Filled 2023-09-21: qty 1

## 2023-09-21 MED ORDER — INSULIN ASPART 100 UNIT/ML IJ SOLN: 0.0000 [IU] | Freq: Every day | INTRAMUSCULAR | Status: DC

## 2023-09-21 MED ORDER — SODIUM CHLORIDE 1 G PO TABS
1.0000 g | ORAL_TABLET | Freq: Two times a day (BID) | ORAL | Status: DC
  Administered 2023-09-21 – 2023-09-23 (×3): 1 g via ORAL
  Filled 2023-09-21 (×4): qty 1

## 2023-09-21 MED ORDER — SODIUM CHLORIDE 0.9 % IV SOLN: INTRAVENOUS | Status: DC

## 2023-09-21 MED ORDER — SODIUM CHLORIDE 0.9% IV SOLUTION
Freq: Once | INTRAVENOUS | Status: AC
  Filled 2023-09-21: qty 250

## 2023-09-21 MED ORDER — THIAMINE MONONITRATE 100 MG PO TABS
100.0000 mg | ORAL_TABLET | Freq: Every day | ORAL | Status: DC
  Administered 2023-09-23: 100 mg via ORAL
  Filled 2023-09-21 (×2): qty 1

## 2023-09-21 MED ORDER — HYDRALAZINE HCL 20 MG/ML IJ SOLN
5.0000 mg | INTRAMUSCULAR | Status: DC | PRN
  Administered 2023-09-22: 5 mg via INTRAVENOUS
  Filled 2023-09-21: qty 1

## 2023-09-21 MED ORDER — FOLIC ACID 1 MG PO TABS
1.0000 mg | ORAL_TABLET | Freq: Every day | ORAL | Status: DC
  Administered 2023-09-21: 1 mg via ORAL
  Filled 2023-09-21 (×2): qty 1

## 2023-09-21 MED ORDER — PANTOPRAZOLE SODIUM 40 MG IV SOLR
40.0000 mg | Freq: Two times a day (BID) | INTRAVENOUS | Status: DC
  Administered 2023-09-21 – 2023-09-23 (×4): 40 mg via INTRAVENOUS
  Filled 2023-09-21 (×4): qty 10

## 2023-09-21 MED ORDER — SODIUM CHLORIDE 1 G PO TABS: 1.0000 g | ORAL_TABLET | Freq: Two times a day (BID) | ORAL | Status: DC

## 2023-09-21 MED ORDER — LORAZEPAM 2 MG/ML IJ SOLN: 1.0000 mg | INTRAMUSCULAR | Status: DC | PRN

## 2023-09-21 MED ORDER — ONDANSETRON HCL 4 MG/2ML IJ SOLN: 4.0000 mg | Freq: Three times a day (TID) | INTRAMUSCULAR | Status: DC | PRN

## 2023-09-21 MED ORDER — PANTOPRAZOLE SODIUM 40 MG IV SOLR
80.0000 mg | Freq: Once | INTRAVENOUS | Status: DC
  Filled 2023-09-21: qty 20

## 2023-09-21 MED ORDER — NICOTINE 21 MG/24HR TD PT24
21.0000 mg | MEDICATED_PATCH | Freq: Every day | TRANSDERMAL | Status: DC
  Administered 2023-09-21 – 2023-09-23 (×3): 21 mg via TRANSDERMAL
  Filled 2023-09-21 (×3): qty 1

## 2023-09-21 MED ORDER — LORAZEPAM 2 MG/ML IJ SOLN: 0.0000 mg | Freq: Two times a day (BID) | INTRAMUSCULAR | Status: DC

## 2023-09-21 MED ORDER — INSULIN ASPART 100 UNIT/ML IJ SOLN
0.0000 [IU] | Freq: Three times a day (TID) | INTRAMUSCULAR | Status: DC
  Administered 2023-09-22: 2 [IU] via SUBCUTANEOUS
  Administered 2023-09-23: 1 [IU] via SUBCUTANEOUS
  Administered 2023-09-23: 2 [IU] via SUBCUTANEOUS
  Filled 2023-09-21 (×3): qty 1

## 2023-09-21 NOTE — H&P (Addendum)
 History and Physical    Omar Watson FMW:[id number] DOB: [birth year] DOA: 09/21/2023  Referring MD/NP/PA:   PCP: Gasper Nancyann BRAVO, MD   Patient coming from:  The patient is coming from home.     Chief Complaint: dark stool, numbness in all extremities  HPI: Omar Watson is a 68 y.o. male with medical history significant of a large precancerous colon polyp removal 2010 (lost follow-up), internal hemorrhoid, tobacco and alcohol abuse, HTN, HLD, DM, gout, gout, medication noncompliance, presents with dark stool.  Pt states that he has had several several episode of dark stool bowel movement.  He is a poor historian.  His sister is at bedside, who does not know the details medical history.  Patient denies nausea, vomiting or abdominal pain.  Denies chest pain, cough, SOB.  Denies symptoms of UTI.  He drinks alcohol every day.  He states that he is not taking any medications currently.  He reports numbness and tingling in all distal extremities.  Denies fall.  No injury.  Of note, patient had a large precancerous colon polyp removed 2010, but lost follow-up.   Data reviewed independently and ED Course: pt was found to have hemoglobin 11.7 on 05/03/2019 --> 7.2 --> 6.4, WBC 9.8, AKI with creatinine 1.32, BUN 16, GFR 60 (baseline creatinine 0.96 recently), negative PCR for COVID, flu and RSV, sodium 128, phosphorus 2.2, magnesium  2.5, temperature normal, blood pressure 101/84, heart rate 80, RR 18, oxygen sat 96% on room air.  CT of head negative for acute intracranial abnormalities.  Patient is placed on MedSurg bed for patient.  Dr. Onita of GI is consulted.  Chest x-ray: Nodular density seen over right lung base which may represent overlying nipple shadow. Repeat radiograph with nipple markers is recommended to rule out pulmonary nodule. No other abnormality seen.    EKG: I have personally reviewed.  Sinus rhythm, QTc 480, no ischemic change.   Review of Systems:   General: no fevers,  chills, no body weight gain, fatigue HEENT: no blurry vision, hearing changes or sore throat Respiratory: no dyspnea, coughing, wheezing CV: no chest pain, no palpitations GI: no nausea, vomiting, abdominal pain, diarrhea, constipation.  Has dark stool GU: no dysuria, burning on urination, increased urinary frequency, hematuria  Ext: no leg edema Neuro: no vision change or hearing loss.  Has tingling in all distal extremities Skin: no rash, no skin tear. MSK: No muscle spasm, no deformity, no limitation of range of movement in spin Heme: No easy bruising.  Travel history: No recent long distant travel.   Allergy:  Allergies  Allergen Reactions   Lisinopril  Cough   Penicillins     Past Medical History:  Diagnosis Date   Gout    History of chicken pox    History of measles    History of mumps     Past Surgical History:  Procedure Laterality Date   APPENDECTOMY  1969    Social History:  reports that he has been smoking cigarettes. He has a 63 pack-year smoking history. He has never used smokeless tobacco. He reports current alcohol use. He reports that he does not use drugs.  Family History:  Family History  Problem Relation Age of Onset   Alzheimer's disease Mother    Heart attack Father    Heart Problems Father        CABG in his 16's     Prior to Admission medications   Medication Sig Start Date End Date Taking? Authorizing Provider  allopurinol  (ZYLOPRIM ) 100 MG tablet Take 1 tablet (100 mg total) by mouth daily. 12/07/20   Gasper Nancyann BRAVO, MD  amLODipine  (NORVASC ) 10 MG tablet Take 1 tablet (10 mg total) by mouth daily. 03/10/21   Gasper Nancyann BRAVO, MD  aspirin EC 81 MG tablet Take 81 mg by mouth daily. Patient not taking: Reported on 05/21/2020    [provider]  glucose blood (ONETOUCH VERIO) test strip Use as instructed to check blood sugar daily Patient not taking: Reported on 02/14/2020 06/08/18   Gasper Nancyann BRAVO, MD  metFORMIN  (GLUCOPHAGE -XR) 500 MG  24 hr tablet TAKE 1 TABLET BY MOUTH DAILY WITH BREAKFAST. FOR 2 WEEKS, THEN INCREASE TO 1 TABLET TWICE DAILY 08/06/20   Gasper Nancyann BRAVO, MD  omeprazole  (PRILOSEC) 20 MG capsule Take 1 capsule (20 mg total) by mouth daily. 03/10/21   Gasper Nancyann BRAVO, MD  rosuvastatin  (CRESTOR ) 10 MG tablet TAKE 1 TABLET BY MOUTH EVERY DAY 07/17/18   Gasper Nancyann BRAVO, MD  sildenafil  (REVATIO ) 20 MG tablet TAKE 3-5 TABLETS BY MOUTH AS NEEDED PRIOR TO INTERCOURSE, NOT TO EXCEED 5 TABLETS IN A DAY 08/02/21   Gasper Nancyann BRAVO, MD  triamcinolone  cream (KENALOG ) 0.1 % Apply 1 application topically 2 (two) times daily. To forearm Patient not taking: Reported on 05/21/2020 02/14/20   Gasper Nancyann BRAVO, MD  valsartan -hydrochlorothiazide  (DIOVAN -HCT) 80-12.5 MG tablet Take 1 tablet by mouth daily. Patient not taking: Reported on 05/21/2020 02/14/20   Gasper Nancyann BRAVO, MD  vitamin B-12 (CYANOCOBALAMIN ) 1000 MCG tablet Take 1 tablet (1,000 mcg total) by mouth daily. Patient not taking: Reported on 02/14/2020 05/31/19   Gasper Nancyann BRAVO, MD  zolpidem  (AMBIEN ) 10 MG tablet TAKE 1 TABLET (10 MG TOTAL) BY MOUTH AT BEDTIME AS NEEDED FOR SLEEP. Patient not taking: Reported on 02/14/2020 06/03/19   Gasper Nancyann BRAVO, MD    Physical Exam: Vitals:   09/21/23 2016 09/22/23 0022 09/22/23 0100 09/22/23 0105  BP: 101/84 (!) 166/63 (!) 157/74 (!) 157/74  Pulse: 80 91 83 83  Resp: 16 20 19 19   Temp: 98.1 F (36.7 C) 97.9 F (36.6 C) 98.2 F (36.8 C) 98.2 F (36.8 C)  TempSrc: Oral Oral Oral Oral  SpO2: 96% 100% 100%   Weight:      Height:       General: Not in acute distress.  Dry mucous membrane HEENT:       Eyes: PERRL, EOMI, no jaundice       ENT: No discharge from the ears and nose, no pharynx injection, no tonsillar enlargement.        Neck: No JVD, no bruit, no mass felt. Heme: No neck lymph node enlargement. Cardiac: S1/S2, RRR, No murmurs, No gallops or rubs. Respiratory: No rales, wheezing, rhonchi or rubs. GI: Soft,  nondistended, nontender, no rebound pain, no organomegaly, BS present. GU: No hematuria Ext: No pitting leg edema bilaterally. 1+DP/PT pulse bilaterally. Musculoskeletal: No joint deformities, No joint redness or warmth, no limitation of ROM in spin. Skin: No rashes.  Neuro: Alert, oriented X3, cranial nerves II-XII grossly intact, moves all extremities normally.  Psych: Patient is not psychotic, no suicidal or hemocidal ideation.  Labs on Admission: I have personally reviewed following labs and imaging studies  CBC: Recent Labs  Lab 09/21/23 1536 09/21/23 2221 09/22/23 0219  WBC 9.8 7.7 7.7  NEUTROABS 7.8*  --   --   HGB 7.2* 6.4* 6.4*  HCT 22.0* 19.7* 19.7*  MCV 105.3* 103.1* 103.1*  PLT  304 261 252   Basic Metabolic Panel: Recent Labs  Lab 09/21/23 1536 09/21/23 1843 09/22/23 0219  NA 129*  --  133*  K 4.0  --  4.1  CL 100  --  104  CO2 15*  --  20*  GLUCOSE 189*  --  124*  BUN 16  --  17  CREATININE 1.32*  --  1.18  CALCIUM  8.0*  --  7.8*  MG 2.5*  --   --   PHOS  --  2.2*  --    GFR: Estimated Creatinine Clearance: 58.4 mL/min (by C-G formula based on SCr of 1.18 mg/dL). Liver Function Tests: Recent Labs  Lab 09/21/23 1536  AST 22  ALT 23  ALKPHOS 58  BILITOT 0.5  PROT 6.6  ALBUMIN 3.7   Recent Labs  Lab 09/21/23 1536  LIPASE 36   No results for input(s): AMMONIA in the last 168 hours. Coagulation Profile: Recent Labs  Lab 09/21/23 1844  INR 1.0   Cardiac Enzymes: No results for input(s): CKTOTAL, CKMB, CKMBINDEX, TROPONINI in the last 168 hours. BNP (last 3 results) No results for input(s): PROBNP in the last 8760 hours. HbA1C: No results for input(s): HGBA1C in the last 72 hours. CBG: Recent Labs  Lab 09/21/23 2133  GLUCAP 122*   Lipid Profile: No results for input(s): CHOL, HDL, LDLCALC, TRIG, CHOLHDL, LDLDIRECT in the last 72 hours. Thyroid  Function Tests: No results for input(s): TSH, T4TOTAL,  FREET4, T3FREE, THYROIDAB in the last 72 hours. Anemia Panel: Recent Labs    09/21/23 2221  FOLATE 13.1  FERRITIN 35  TIBC 442  IRON 49  RETICCTPCT 11.6*   Urine analysis: No results found for: COLORURINE, APPEARANCEUR, LABSPEC, PHURINE, GLUCOSEU, HGBUR, BILIRUBINUR, KETONESUR, PROTEINUR, UROBILINOGEN, NITRITE, LEUKOCYTESUR Sepsis Labs: @LABRCNTIP (procalcitonin:4,lacticidven:4) ) Recent Results (from the past 240 hours)  Resp panel by RT-PCR (RSV, Flu A&B, Covid) Anterior Nasal Swab     Status: None   Collection Time: 09/21/23  6:44 PM   Specimen: Anterior Nasal Swab  Result Value Ref Range Status   SARS Coronavirus 2 by RT PCR NEGATIVE NEGATIVE Final    Comment: (NOTE) SARS-CoV-2 target nucleic acids are NOT DETECTED.  The SARS-CoV-2 RNA is generally detectable in upper respiratory specimens during the acute phase of infection. The lowest concentration of SARS-CoV-2 viral copies this assay can detect is 138 copies/mL. A negative result does not preclude SARS-Cov-2 infection and should not be used as the sole basis for treatment or other patient management decisions. A negative result may occur with  improper specimen collection/handling, submission of specimen other than nasopharyngeal swab, presence of viral mutation(s) within the areas targeted by this assay, and inadequate number of viral copies(<138 copies/mL). A negative result must be combined with clinical observations, patient history, and epidemiological information. The expected result is Negative.  Fact Sheet for Patients:  bloggercourse.com  Fact Sheet for Healthcare Providers:  seriousbroker.it  This test is no t yet approved or cleared by the [place]  FDA and  has been authorized for detection and/or diagnosis of SARS-CoV-2 by FDA under an Emergency Use Authorization (EUA). This EUA will remain  in effect (meaning this  test can be used) for the duration of the COVID-19 declaration under Section 564(b)(1) of the Act, 21 U.S.C.section 360bbb-3(b)(1), unless the authorization is terminated  or revoked sooner.       Influenza A by PCR NEGATIVE NEGATIVE Final   Influenza B by PCR NEGATIVE NEGATIVE Final    Comment: (NOTE) The  Xpert Xpress SARS-CoV-2/FLU/RSV plus assay is intended as an aid in the diagnosis of influenza from Nasopharyngeal swab specimens and should not be used as a sole basis for treatment. Nasal washings and aspirates are unacceptable for Xpert Xpress SARS-CoV-2/FLU/RSV testing.  Fact Sheet for Patients: bloggercourse.com  Fact Sheet for Healthcare Providers: seriousbroker.it  This test is not yet approved or cleared by the [place]  FDA and has been authorized for detection and/or diagnosis of SARS-CoV-2 by FDA under an Emergency Use Authorization (EUA). This EUA will remain in effect (meaning this test can be used) for the duration of the COVID-19 declaration under Section 564(b)(1) of the Act, 21 U.S.C. section 360bbb-3(b)(1), unless the authorization is terminated or revoked.     Resp Syncytial Virus by PCR NEGATIVE NEGATIVE Final    Comment: (NOTE) Fact Sheet for Patients: bloggercourse.com  Fact Sheet for Healthcare Providers: seriousbroker.it  This test is not yet approved or cleared by the [place]  FDA and has been authorized for detection and/or diagnosis of SARS-CoV-2 by FDA under an Emergency Use Authorization (EUA). This EUA will remain in effect (meaning this test can be used) for the duration of the COVID-19 declaration under Section 564(b)(1) of the Act, 21 U.S.C. section 360bbb-3(b)(1), unless the authorization is terminated or revoked.  Performed at [facility], [address] [place], [place] [address]      Radiological Exams on  Admission:   Assessment/Plan Principal Problem:   GI bleeding Active Problems:   Acute blood loss anemia   Essential (primary) hypertension   GERD (gastroesophageal reflux disease)   AKI (acute kidney injury) (HCC)   HLD (hyperlipidemia)   Hyponatremia   Diabetes mellitus without complication (HCC)   Peripheral neuropathy   Tobacco abuse   Alcohol abuse   Hypophosphatemia   Assessment and Plan:   GI bleeding and acute blood loss anemia: Hemoglobin 11.7 --> 7.2 --> 6.4. Consulted Dr. Onita of GI  - will place in med-surg bed obs - transfuse 1 unit of blood now - NPO after MN - IVF: 1L NS bolus, then at 75 mL/hr - Start IV pantoprazole   40 mg bid - Check anemia panel - Zofran  IV for nausea - Avoid NSAIDs and SQ heparin - Maintain IV access (2 large bore IVs if possible). - Monitor closely and follow q6h cbc, transfuse as necessary, if Hgb<7.0 - LaB: INR, PTT and type screen   Essential (primary) hypertension: Blood pressure 101/84.  Patient is not taking medications. -IV hydralazine  as needed  GERD (gastroesophageal reflux disease) -On Protonix   AKI (acute kidney injury) (HCC): Likely due to dehydration.  Baseline creatinine 0.96 on 05/03/2019.  His creatinine 7.32, BUN 16, GFR 60. -IV fluid as above -Avoid using renal toxic medications  HLD (hyperlipidemia) -Follow-up with PCP  Hyponatremia: Sodium 129, most likely due to alcohol abuse -Fluid restriction -Sodium chloride  1 g twice daily -IV normal saline as above  Diabetes mellitus without complication [facility]): Not taking his metformin  currently.  A1c 7.9, poorly controlled. -Sliding scale insulin   Peripheral neuropathy: Patient reports tingling and numbness in all distal extremities -Start Neurontin  100 mg nightly -Check vitamin B12 and TSH level  Tobacco abuse and Alcohol abuse -Nicotine  patch -CIWA protocol -Did counseling about importance of quitting tobacco and alcohol use  Hypophosphatemia:  Phosphorus of 2.2.  Magnesium  normal 2.5, potassium 4.0. -Repleted phosphorus with 15 mmol of potassium phosphate      DVT ppx: SCD  Code Status: Full code    Family Communication: Yes, patient's sister  at bed side.    Disposition Plan:  Anticipate discharge back to previous environment  Consults called: Dr. Onita for GI  Admission status and Level of care: Med-Surg:    for obs as inpt        Dispo: The patient is from: Home              Anticipated d/c is to: Home              Anticipated d/c date is: 1 day              Patient currently is not medically stable to d/c.    Severity of Illness:  The appropriate patient status for this patient is OBSERVATION. Observation status is judged to be reasonable and necessary in order to provide the required intensity of service to ensure the patient's safety. The patient's presenting symptoms, physical exam findings, and initial radiographic and laboratory data in the context of their medical condition is felt to place them at decreased risk for further clinical deterioration. Furthermore, it is anticipated that the patient will be medically stable for discharge from the hospital within 2 midnights of admission.        Date of Service 09/22/2023    Caleb Exon [facility]   If 7PM-7AM, please contact night-coverage [email or web address] 09/22/2023, 1:09 AM

## 2023-09-21 NOTE — ED Notes (Signed)
 See triage notes. Patient c/o "not feeling well." Friend in the room states he fell a few days ago. Patient is not a good historian.

## 2023-09-21 NOTE — ED Provider Notes (Signed)
 [facility] Provider Note    Event Date/Time   First MD Initiated Contact with Patient 09/21/23 1712     (approximate)   History   Numbness   HPI  Omar Watson is a 68 y.o. male past medical history significant for diabetes, hypertension, hyperlipidemia, tobacco use, alcohol use, who presents to the emergency department for not feeling well.  States that he is not been feeling well for the past couple of days.  States that he feels poorly all over.  Complaining of numbness and tingling sensation that is worse in his left leg but also present in his right leg.  Just states that he does not feel well.  Denies any abdominal pain.  Does endorse alcohol use and believes his last drink of alcohol was approximately 1 week ago.  Denies any falls or head trauma.  Denies any slurring of his speech.  States he does just feel more off than normal.  Does endorse melanotic stool and states that he has had multiple episodes of black stool.  Has not followed up since his last colonoscopy that was multiple years ago.  No known history of varices.  Denies any daily ibuprofen use.     Physical Exam   Triage Vital Signs: ED Triage Vitals  Encounter Vitals Group     BP 09/21/23 1509 101/84     Systolic BP Percentile --      Diastolic BP Percentile --      Pulse Rate 09/21/23 1509 80     Resp 09/21/23 1509 18     Temp 09/21/23 1509 97.7 F (36.5 C)     Temp Source 09/21/23 1509 Oral     SpO2 09/21/23 1509 96 %     Weight 09/21/23 1507 155 lb (70.3 kg)     Height 09/21/23 1507 5' 7 (1.702 m)     Head Circumference --      Peak Flow --      Pain Score 09/21/23 1507 0     Pain Loc --      Pain Education --      Exclude from Growth Chart --     Most recent vital signs: Vitals:   09/21/23 1509  BP: 101/84  Pulse: 80  Resp: 18  Temp: 97.7 F (36.5 C)  SpO2: 96%    Physical Exam Exam conducted with a chaperone present.  Constitutional:      Appearance: He is  well-developed.  HENT:     Head: Atraumatic.  Eyes:     Extraocular Movements: Extraocular movements intact.     Conjunctiva/sclera: Conjunctivae normal.     Pupils: Pupils are equal, round, and reactive to light.  Cardiovascular:     Rate and Rhythm: Regular rhythm.  Pulmonary:     Effort: No respiratory distress.     Breath sounds: No wheezing.  Abdominal:     Tenderness: There is no abdominal tenderness.  Genitourinary:    Comments: Melanotic stool Musculoskeletal:        General: Normal range of motion.     Cervical back: Normal range of motion. No tenderness.     Right lower leg: No edema.     Left lower leg: No edema.     Comments: Palpable DP pulses that are equal bilaterally.  Good capillary refill to both of his legs.  No lower extremity edema.  Soft compartments.  Skin:    General: Skin is warm.     Capillary Refill: Capillary  refill takes less than 2 seconds.  Neurological:     Mental Status: He is alert. Mental status is at baseline.     GCS: GCS eye subscore is 4. GCS verbal subscore is 5. GCS motor subscore is 6.     Cranial Nerves: Cranial nerves 2-12 are intact.     Sensory: Sensation is intact.     Motor: Motor function is intact.     Comments: Slurred speech but states that this is his baseline  Psychiatric:        Mood and Affect: Mood normal.     IMPRESSION / MDM / ASSESSMENT AND PLAN / ED COURSE  I reviewed the triage vital signs and the nursing notes.  Differential diagnosis including viral illness including COVID/influenza, electrolyte abnormality, dehydration, intracranial hemorrhage, CVA, EKG  I, Clotilda Punter, the attending physician, personally viewed and interpreted this ECG.  Sinus tachycardia with a heart rate of 106.  No significant ST elevation or depression.  No signs of acute ischemia or dysrhythmia.  RADIOLOGY I independently reviewed imaging, my interpretation of imaging: CT scan of the head with no signs of intracranial hemorrhage  or infarction.  Chest x-ray with questionable nodular density over the right lung base.  LABS (all labs ordered are listed, but only abnormal results are displayed) Labs interpreted as -    Labs Reviewed  COMPREHENSIVE METABOLIC PANEL - Abnormal; Notable for the following components:      Result Value   Sodium 129 (*)    CO2 15 (*)    Glucose, Bld 189 (*)    Creatinine, Ser 1.32 (*)    Calcium  8.0 (*)    GFR, Estimated 60 (*)    All other components within normal limits  CBC WITH DIFFERENTIAL/PLATELET - Abnormal; Notable for the following components:   RBC 2.09 (*)    Hemoglobin 7.2 (*)    HCT 22.0 (*)    MCV 105.3 (*)    MCH 34.4 (*)    Neutro Abs 7.8 (*)    Abs Immature Granulocytes 0.11 (*)    All other components within normal limits  MAGNESIUM  - Abnormal; Notable for the following components:   Magnesium  2.5 (*)    All other components within normal limits  RESP PANEL BY RT-PCR (RSV, FLU A&B, COVID)  RVPGX2  LIPASE, BLOOD  ETHANOL  PROTIME-INR  URINALYSIS, W/ REFLEX TO CULTURE (INFECTION SUSPECTED)  TYPE AND SCREEN     MDM    Patient found to have significant lead low hemoglobin of 7.2.  Does this appear microcytic.  Patient does have melanotic stool on exam and mild tachycardia.  History of alcohol use and concern for possible upper GI bleed.  No history of variceal bleed.  Hemodynamically stable and not on anticoagulation.  Patient was typed and screened.  INR within normal limits.  Started on IV Protonix .  Hyponatremia likely secondary to alcohol use, does appear mildly dehydrated, CO2 is low at 15 however has a normal anion gap.  No other significant electrolyte abnormality.  COVID and influenza testing are negative.  Troponin negative and no findings of acute ischemia, doubt ACS.  Possible glove and stocking neuropathy.  Has good palpable pulses to lower extremities and is well-perfused.  Doubt peripheral arterial disease or dissection.  Consulted  hospitalist for admission for generalized weakness and fatigue in the setting of anemia with a hemoglobin of 7.2 and concern for possible upper GI bleed.   PROCEDURES:  Critical Care performed: No  Procedures  Patient's  presentation is most consistent with acute presentation with potential threat to life or bodily function.   MEDICATIONS ORDERED IN ED: Medications  pantoprazole  (PROTONIX ) injection 80 mg (has no administration in time range)  sodium chloride  0.9 % bolus 1,000 mL (1,000 mLs Intravenous New Bag/Given 09/21/23 1845)    FINAL CLINICAL IMPRESSION(S) / ED DIAGNOSES   Final diagnoses:  Anemia, unspecified type  Melena  Paresthesia  Hyponatremia     Rx / DC Orders   ED Discharge Orders     None        Note:  This document was prepared using Dragon voice recognition software and may include unintentional dictation errors.   Suzanne Kirsch, MD 09/21/23 [phone]

## 2023-09-21 NOTE — ED Notes (Signed)
 Provider notified of critical HGB, plan for transfusion.

## 2023-09-21 NOTE — ED Notes (Signed)
 First Nurse Note: Pt to ED via [facility] from home for weakness. Pt called EMS because he was not feeling good. Pt is a daily drinker but has not had a drink in 3 days. Pt has hx/o DM but has not taken his medication in years. Pt is in NAD.   VSS stable with EMS

## 2023-09-21 NOTE — ED Triage Notes (Signed)
 Patient to ED via [facility] from home for left leg numbness x3 days. Pt report "not feeling well" and that he is SOB. States he normally drinks a 6 pack a day but has not drank in the last 3 days.

## 2023-09-21 NOTE — ED Provider Triage Note (Signed)
 Emergency Medicine Provider Triage Evaluation Note  Omar Watson , a 68 y.o. male  was evaluated in triage.  Pt complains of not feeling good, states his left leg is numb which began 3 days ago. Patient is a daily drinker, has a 6 pack a day, last drink was 3 days ago.   Review of Systems  Positive: SOB, left leg numbness Negative: CP, cough, congestion  Physical Exam  There were no vitals taken for this visit. Gen:   Awake, no distress Resp:  Normal effort  MSK:   Moves extremities without difficulty  Other:    Medical Decision Making  Medically screening exam initiated at 3:04 PM.  Appropriate orders placed.  Omar Watson was informed that the remainder of the evaluation will be completed by another provider, this initial triage assessment does not replace that evaluation, and the importance of remaining in the ED until their evaluation is complete.     Omar Tinnie LABOR, PA-C 09/21/23 [phone]

## 2023-09-22 ENCOUNTER — Other Ambulatory Visit: Payer: Self-pay

## 2023-09-22 ENCOUNTER — Observation Stay: Payer: Medicare Other | Admitting: Registered Nurse

## 2023-09-22 ENCOUNTER — Encounter
Admission: EM | Disposition: A | Discharge status: Home / Self Care | Payer: Self-pay | Source: Home / Self Care | Attending: Internal Medicine

## 2023-09-22 ENCOUNTER — Encounter: Payer: Self-pay | Admitting: Internal Medicine

## 2023-09-22 DIAGNOSIS — I10 Essential (primary) hypertension: Secondary | ICD-10-CM | POA: Diagnosis present

## 2023-09-22 DIAGNOSIS — K921 Melena: Secondary | ICD-10-CM | POA: Diagnosis present

## 2023-09-22 DIAGNOSIS — E1140 Type 2 diabetes mellitus with diabetic neuropathy, unspecified: Secondary | ICD-10-CM | POA: Diagnosis present

## 2023-09-22 DIAGNOSIS — Z79899 Other long term (current) drug therapy: Secondary | ICD-10-CM | POA: Diagnosis not present

## 2023-09-22 DIAGNOSIS — K2100 Gastro-esophageal reflux disease with esophagitis, without bleeding: Secondary | ICD-10-CM | POA: Diagnosis present

## 2023-09-22 DIAGNOSIS — G8929 Other chronic pain: Secondary | ICD-10-CM | POA: Diagnosis present

## 2023-09-22 DIAGNOSIS — F1010 Alcohol abuse, uncomplicated: Secondary | ICD-10-CM | POA: Diagnosis present

## 2023-09-22 DIAGNOSIS — E781 Pure hyperglyceridemia: Secondary | ICD-10-CM | POA: Diagnosis present

## 2023-09-22 DIAGNOSIS — T39311A Poisoning by propionic acid derivatives, accidental (unintentional), initial encounter: Secondary | ICD-10-CM | POA: Diagnosis present

## 2023-09-22 DIAGNOSIS — F17210 Nicotine dependence, cigarettes, uncomplicated: Secondary | ICD-10-CM | POA: Diagnosis present

## 2023-09-22 DIAGNOSIS — D62 Acute posthemorrhagic anemia: Secondary | ICD-10-CM | POA: Diagnosis present

## 2023-09-22 DIAGNOSIS — R202 Paresthesia of skin: Secondary | ICD-10-CM

## 2023-09-22 DIAGNOSIS — T383X6A Underdosing of insulin and oral hypoglycemic [antidiabetic] drugs, initial encounter: Secondary | ICD-10-CM | POA: Diagnosis present

## 2023-09-22 DIAGNOSIS — M549 Dorsalgia, unspecified: Secondary | ICD-10-CM | POA: Diagnosis present

## 2023-09-22 DIAGNOSIS — Z7982 Long term (current) use of aspirin: Secondary | ICD-10-CM | POA: Diagnosis not present

## 2023-09-22 DIAGNOSIS — D649 Anemia, unspecified: Secondary | ICD-10-CM

## 2023-09-22 DIAGNOSIS — Z1152 Encounter for screening for COVID-19: Secondary | ICD-10-CM | POA: Diagnosis not present

## 2023-09-22 DIAGNOSIS — K3189 Other diseases of stomach and duodenum: Secondary | ICD-10-CM | POA: Diagnosis present

## 2023-09-22 DIAGNOSIS — E860 Dehydration: Secondary | ICD-10-CM | POA: Diagnosis present

## 2023-09-22 DIAGNOSIS — N179 Acute kidney failure, unspecified: Secondary | ICD-10-CM | POA: Diagnosis present

## 2023-09-22 DIAGNOSIS — K259 Gastric ulcer, unspecified as acute or chronic, without hemorrhage or perforation: Secondary | ICD-10-CM | POA: Diagnosis present

## 2023-09-22 DIAGNOSIS — E871 Hypo-osmolality and hyponatremia: Secondary | ICD-10-CM | POA: Diagnosis present

## 2023-09-22 DIAGNOSIS — T461X6A Underdosing of calcium-channel blockers, initial encounter: Secondary | ICD-10-CM | POA: Diagnosis present

## 2023-09-22 DIAGNOSIS — M109 Gout, unspecified: Secondary | ICD-10-CM | POA: Diagnosis present

## 2023-09-22 DIAGNOSIS — R911 Solitary pulmonary nodule: Secondary | ICD-10-CM | POA: Diagnosis present

## 2023-09-22 DIAGNOSIS — E785 Hyperlipidemia, unspecified: Secondary | ICD-10-CM

## 2023-09-22 DIAGNOSIS — K2901 Acute gastritis with bleeding: Secondary | ICD-10-CM | POA: Diagnosis present

## 2023-09-22 HISTORY — PX: ESOPHAGOGASTRODUODENOSCOPY: SHX5428

## 2023-09-22 LAB — CBC
HCT: 20.9 % — ABNORMAL LOW (ref 39.0–52.0)
HCT: 28.4 % — ABNORMAL LOW (ref 39.0–52.0)
HCT: 29 % — ABNORMAL LOW (ref 39.0–52.0)
Hemoglobin: 7.1 g/dL — ABNORMAL LOW (ref 13.0–17.0)
Hemoglobin: 9.8 g/dL — ABNORMAL LOW (ref 13.0–17.0)
Hemoglobin: 9.8 g/dL — ABNORMAL LOW (ref 13.0–17.0)
MCH: 32.7 pg (ref 26.0–34.0)
MCH: 32.7 pg (ref 26.0–34.0)
MCH: 31.7 pg (ref 26.0–34.0)
MCHC: 34 g/dL (ref 30.0–36.0)
MCHC: 34.5 g/dL (ref 30.0–36.0)
MCHC: 33.8 g/dL (ref 30.0–36.0)
MCV: 96.3 fL (ref 80.0–100.0)
MCV: 94.7 fL (ref 80.0–100.0)
MCV: 93.9 fL (ref 80.0–100.0)
Platelets: 210 10*3/uL (ref 150–400)
Platelets: 234 10*3/uL (ref 150–400)
Platelets: 251 10*3/uL (ref 150–400)
RBC: 2.17 MIL/uL — ABNORMAL LOW (ref 4.22–5.81)
RBC: 3 MIL/uL — ABNORMAL LOW (ref 4.22–5.81)
RBC: 3.09 MIL/uL — ABNORMAL LOW (ref 4.22–5.81)
RDW: 15.7 % — ABNORMAL HIGH (ref 11.5–15.5)
RDW: 16.3 % — ABNORMAL HIGH (ref 11.5–15.5)
RDW: 16.7 % — ABNORMAL HIGH (ref 11.5–15.5)
WBC: 6.4 10*3/uL (ref 4.0–10.5)
WBC: 6.1 10*3/uL (ref 4.0–10.5)
WBC: 8 10*3/uL (ref 4.0–10.5)
nRBC: 0 % (ref 0.0–0.2)
nRBC: 0 % (ref 0.0–0.2)
nRBC: 0 % (ref 0.0–0.2)

## 2023-09-22 LAB — FOLATE: Folate: 13.1 ng/mL (ref >5.9)

## 2023-09-22 LAB — FERRITIN: Ferritin: 35 ng/mL (ref 24–336)

## 2023-09-22 LAB — PREPARE RBC (CROSSMATCH)

## 2023-09-22 LAB — GLUCOSE, CAPILLARY
Glucose-Capillary: 182 mg/dL — ABNORMAL HIGH (ref 70–99)
Glucose-Capillary: 118 mg/dL — ABNORMAL HIGH (ref 70–99)

## 2023-09-22 LAB — VITAMIN B12: Vitamin B-12: 323 pg/mL (ref 180–914)

## 2023-09-22 LAB — PHOSPHORUS: Phosphorus: 2.4 mg/dL — ABNORMAL LOW (ref 2.5–4.6)

## 2023-09-22 LAB — TSH: TSH: 3.568 u[IU]/mL (ref 0.350–4.500)

## 2023-09-22 LAB — CBG MONITORING, ED
Glucose-Capillary: 111 mg/dL — ABNORMAL HIGH (ref 70–99)
Glucose-Capillary: 116 mg/dL — ABNORMAL HIGH (ref 70–99)

## 2023-09-22 LAB — ABO/RH: ABO/RH(D): A POS

## 2023-09-22 LAB — IRON AND TIBC
Iron: 49 ug/dL (ref 45–182)
Saturation Ratios: 11 % — ABNORMAL LOW (ref 17.9–39.5)
TIBC: 442 ug/dL (ref 250–450)
UIBC: 393 ug/dL

## 2023-09-22 LAB — HIV ANTIBODY (ROUTINE TESTING W REFLEX): HIV Screen 4th Generation wRfx: NONREACTIVE

## 2023-09-22 SURGERY — EGD (ESOPHAGOGASTRODUODENOSCOPY)
Anesthesia: General

## 2023-09-22 MED ORDER — PROPOFOL 500 MG/50ML IV EMUL
INTRAVENOUS | Status: DC | PRN
  Administered 2023-09-22: 100 ug/kg/min via INTRAVENOUS

## 2023-09-22 MED ORDER — AMLODIPINE BESYLATE 10 MG PO TABS
10.0000 mg | ORAL_TABLET | Freq: Every day | ORAL | Status: DC
  Administered 2023-09-22 – 2023-09-23 (×2): 10 mg via ORAL
  Filled 2023-09-22: qty 1
  Filled 2023-09-22: qty 2

## 2023-09-22 MED ORDER — GLYCOPYRROLATE 0.2 MG/ML IJ SOLN
INTRAMUSCULAR | Status: DC | PRN
  Administered 2023-09-22: .2 mg via INTRAVENOUS

## 2023-09-22 MED ORDER — PROPOFOL 10 MG/ML IV BOLUS
INTRAVENOUS | Status: DC | PRN
  Administered 2023-09-22: 50 mg via INTRAVENOUS

## 2023-09-22 MED ORDER — FE FUM-VIT C-VIT B12-FA 460-60-0.01-1 MG PO CAPS
1.0000 | ORAL_CAPSULE | Freq: Two times a day (BID) | ORAL | Status: DC
  Administered 2023-09-23: 1 via ORAL
  Filled 2023-09-22: qty 1

## 2023-09-22 MED ORDER — LIDOCAINE HCL (CARDIAC) PF 100 MG/5ML IV SOSY
PREFILLED_SYRINGE | INTRAVENOUS | Status: DC | PRN
  Administered 2023-09-22: 40 mg via INTRAVENOUS

## 2023-09-22 MED ORDER — SODIUM CHLORIDE 0.9% IV SOLUTION
Freq: Once | INTRAVENOUS | Status: DC
  Filled 2023-09-22: qty 250

## 2023-09-22 MED ORDER — GABAPENTIN 100 MG PO CAPS
100.0000 mg | ORAL_CAPSULE | Freq: Every day | ORAL | Status: DC
  Administered 2023-09-22 (×2): 100 mg via ORAL
  Filled 2023-09-22 (×2): qty 1

## 2023-09-22 NOTE — Assessment & Plan Note (Signed)
 Has not seen physician for many years and not taking his medications.  Prior A1c of 7.9  4 years ago. CBG within goal -Check A1c -Continue with SSI

## 2023-09-22 NOTE — Assessment & Plan Note (Signed)
-  Patient is on IV Protonix

## 2023-09-22 NOTE — Hospital Course (Addendum)
 Taken from H&P.   Omar Watson is a 68 y.o. male with medical history significant of a large precancerous colon polyp removal 2010 (lost follow-up), internal hemorrhoid, tobacco and alcohol abuse, HTN, HLD, DM, gout,  medication noncompliance, presents with dark stool.  History of several melanotic stools over the last couple of week.  Patient takes Aleve 2-3 times weekly for chronic back pain and headaches.  He had a colonoscopy at the age of 20 if and at that time polyps were found and removed, no follow-up afterwards.  On presentation he was found to have a hemoglobin of 6.4 with baseline appears to be around 12, BUN 16, creatinine 1.32,negative PCR for COVID, flu and RSV, sodium 128, phosphorus 2.2, magnesium  2.5. CXR with nodular density seen over right lung base which may represent overlying nipple shadow. Repeat radiograph with nipple markers is recommended to rule out pulmonary nodule. No other abnormality seen.  Patient received normal saline bolus, ordered 1 unit of PRBC and GI was consulted.  1/3: Blood pressure elevated this morning at 185/75.  Patient was not taking his home antihypertensives for a while.  Restarted amlodipine .  Hemoglobin 7.1, sodium improved to 133, CO2 20, creatinine 1.18.  Anemia panel with borderline low iron saturation and others, B12 at 323 with goal of about 400. Ordered 1 more unit of PRBC.  Going for EGD today.  1/4: Blood pressure mildly elevated at 155/81.  Hemoglobin stable at 9.8.  S/p 2 unit of PRBC.  Had 1 more small melanotic stool.  EGD yesterday with some erosive gastritis, likely with NSAID use.  No active bleeding.  Biopsies were taken for H. pylori with pending results.  GI is recommending twice daily PPI for which he was ordered Nexium  twice daily and need to have a close follow-up with gastroenterology for biopsy results and a likely colonoscopy as he had his prior colonoscopy done at the age of 25 and need reevaluation now.  A1c was found  to be at 6.1, which makes him in prediabetic range.  Patient used to take metformin  before so it was restarted.  He was also started on amlodipine  and might need another agent for which his PCP can monitor and treat accordingly.  Patient was told to avoid NSAID and use Tylenol  as needed for pain.  Patient is being discharged on Nexium  40 mg twice daily, amlodipine , iron supplement and metformin .  New prescriptions were provided.  Patient need to follow-up with his primary care provider and gastroenterologist for further management.

## 2023-09-22 NOTE — Assessment & Plan Note (Signed)
 Patient having melanotic stool.  History of NSAID use. Have not seen a physician or follow-up colonoscopy since age 68. GI was consulted and going for EGD today. Anemia panel with borderline low iron saturation and others, B12 at 323 with goal of about 400. Hemoglobin at 7.1 today s/p 1 unit of PRBC. -Giving 1 more unit -Continue to monitor hemoglobin -Continue with supportive care and IV PPI -Follow-up postprocedure recommendations

## 2023-09-22 NOTE — Transfer of Care (Signed)
 Immediate Anesthesia Transfer of Care Note  Patient: Omar Watson  Procedure(s) Performed: ESOPHAGOGASTRODUODENOSCOPY (EGD)  Patient Location: PACU  Anesthesia Type:General  Level of Consciousness: drowsy and patient cooperative  Airway & Oxygen Therapy: Patient Spontanous Breathing  Post-op Assessment: Report given to RN and Post -op Vital signs reviewed and stable  Post vital signs: stable  Last Vitals:  Vitals Value Taken Time  BP 101/51 09/22/23 1344  Temp    Pulse 101 09/22/23 1344  Resp 17 09/22/23 1344  SpO2 99 % 09/22/23 1344  Vitals shown include unfiled device data.  Last Pain:  Vitals:   09/22/23 1344  TempSrc:   PainSc: 0-No pain         Complications: No notable events documented.

## 2023-09-22 NOTE — Anesthesia Postprocedure Evaluation (Signed)
 Anesthesia Post Note  Patient: Omar Watson  Procedure(s) Performed: ESOPHAGOGASTRODUODENOSCOPY (EGD)  Patient location during evaluation: PACU Anesthesia Type: General Level of consciousness: awake and alert, oriented and patient cooperative Pain management: pain level controlled Vital Signs Assessment: post-procedure vital signs reviewed and stable Respiratory status: spontaneous breathing, nonlabored ventilation and respiratory function stable Cardiovascular status: blood pressure returned to baseline and stable Postop Assessment: adequate PO intake Anesthetic complications: no   No notable events documented.   Last Vitals:  Vitals:   09/22/23 1354 09/22/23 1404  BP: (!) 153/77 (!) 157/94  Pulse: (!) 102 (!) 103  Resp: 18 17  Temp:    SpO2: 100% 100%    Last Pain:  Vitals:   09/22/23 1404  TempSrc:   PainSc: 0-No pain                 Alfonso Ruths

## 2023-09-22 NOTE — Assessment & Plan Note (Signed)
 Creatinine improving, currently at 1.18.  Baseline was less than 1, 4 years ago. -Monitor renal function -Avoid nephrotoxins

## 2023-09-22 NOTE — Assessment & Plan Note (Signed)
 Improving with repletion. -Continue to monitor

## 2023-09-22 NOTE — Plan of Care (Signed)
  Problem: Nutritional: Goal: Maintenance of adequate nutrition will improve Outcome: Progressing Goal: Progress toward achieving an optimal weight will improve Outcome: Progressing   Problem: Skin Integrity: Goal: Risk for impaired skin integrity will decrease Outcome: Progressing   Problem: Activity: Goal: Risk for activity intolerance will decrease Outcome: Progressing   Problem: Nutrition: Goal: Adequate nutrition will be maintained Outcome: Progressing

## 2023-09-22 NOTE — Consult Note (Signed)
 [facility] GI Inpatient Consult Note   Ladell Francis Boss, M.D.  Reason for Consult: melena, anemia secondary to gastrointestinal blood loss   Attending Requesting Consult: Amaryllis Dare, M.D.  Outpatient Primary Physician: Nancyann Perry, M.D.  History of Present Illness: Omar Watson is a 68 y.o. male with a reported history of alcohol abuse who presents to the emergency room after symptoms of weakness and subjective confusion.  The patient appears alert and oriented at this time although his speech is somewhat slowed.  Report was that the patient had a few black stools over the last week which accompanied his progressive weakness.  Patient at this time denies that he mention bleeding to anyone although stool sample was obtained that was reportedly melenic in gross appearance and was occult positive.  Patient denies any abdominal pain, chest pain, shortness of breath, dyspnea on exertion or cough.  He does take Aleve 2-3 times a week for chronic back pain and headaches.  He lost his insurance when he retired from active work 3 years ago.  For this reason, he no longer takes prescribed medicine for type 2 diabetes mellitus and hypertension.  He reports drinking 6 beers daily on a regular basis.  He says last alcoholic beverage was 3 days ago. Patient has remote history of colonoscopy at the age of 61 at which time polyps were found and removed.  He was supposed to follow-up with surveillance colonoscopy but admits that he never did follow-up with his gastroenterologist at the time. From an upper GI standpoint the patient denies any dysphagia, anorexia, involuntary weight loss, hematemesis or frequent GERD symptoms.  From a neurologic standpoint, patient denies any history of seizure or stroke or movement disorder. Patient denies any hematuria, pyuria, urinary hesitancy from a urologic standpoint.   Past Medical History:  Past Medical History:  Diagnosis Date   Gout    History  of chicken pox    History of measles    History of mumps     Problem List: Patient Active Problem List   Diagnosis Date Noted   Acute blood loss anemia 09/22/2023   Hypophosphatemia 09/22/2023   GI bleeding 09/21/2023   HLD (hyperlipidemia) 09/21/2023   Diabetes mellitus without complication (HCC) 09/21/2023   Tobacco abuse 09/21/2023   Alcohol abuse 09/21/2023   AKI (acute kidney injury) (HCC) 09/21/2023   Peripheral neuropathy 09/21/2023   Hyponatremia 09/21/2023   Vitamin B12 deficiency anemia 05/08/2019   Diabetes (HCC) 04/05/2018   Excessive drinking alcohol 04/05/2018   DOE (dyspnea on exertion) 10/22/2015   ED (erectile dysfunction) of organic origin 10/22/2015   GERD (gastroesophageal reflux disease) 10/22/2015   Hypertriglyceridemia 10/22/2015   Hyperuricemia 10/22/2015   Smoking greater than 30 pack years 10/22/2015   Insomnia 06/03/2009   Essential (primary) hypertension 02/24/2009   Gout 02/04/2009   History of adenomatous polyp of colon 01/01/2009    Past Surgical History: Past Surgical History:  Procedure Laterality Date   APPENDECTOMY  1969    Allergies: Allergies  Allergen Reactions   Lisinopril  Cough   Penicillins     Home Medications: (Not in a hospital admission)  Home medication reconciliation was completed with the patient.   Scheduled Inpatient Medications:    folic acid   1 mg Oral Daily   gabapentin   100 mg Oral QHS   insulin  aspart  0-5 Units Subcutaneous QHS   insulin  aspart  0-9 Units Subcutaneous TID WC   LORazepam   0-4 mg Intravenous Q6H   Followed  by   NOREEN ON 09/23/2023] LORazepam   0-4 mg Intravenous Q12H   multivitamin with minerals  1 tablet Oral Daily   nicotine   21 mg Transdermal Daily   pantoprazole  (PROTONIX ) IV  40 mg Intravenous Q12H   sodium chloride   1 g Oral BID WC   thiamine   100 mg Oral Daily   Or   thiamine   100 mg Intravenous Daily    Continuous Inpatient Infusions:    sodium chloride  75 mL/hr at 09/21/23  2257   sodium chloride       PRN Inpatient Medications:  acetaminophen , hydrALAZINE , LORazepam  **OR** LORazepam , ondansetron  (ZOFRAN ) IV, zolpidem   Family History: family history includes Alzheimer's disease in his mother; Heart Problems in his father; Heart attack in his father.   GI Family History: Negative  Social History:   reports that he has been smoking cigarettes. He has a 63 pack-year smoking history. He has never used smokeless tobacco. He reports current alcohol use. He reports that he does not use drugs. The patient denies ETOH, tobacco, or drug use.    Review of Systems: Review of Systems - Negative except HPI  Physical Examination: BP (!) 185/75 (BP Location: Right Arm)   Pulse 74   Temp 98.3 F (36.8 C) (Oral)   Resp 18   Ht 5' 7 (1.702 m)   Wt 70.3 kg   SpO2 100%   BMI 24.28 kg/m  Physical Exam Constitutional:      General: He is not in acute distress.    Appearance: He is ill-appearing. He is not toxic-appearing or diaphoretic.  HENT:     Head: Normocephalic and atraumatic.     Nose: Nose normal.     Mouth/Throat:     Mouth: Mucous membranes are dry.  Eyes:     General: No scleral icterus.    Pupils: Pupils are equal, round, and reactive to light.  Cardiovascular:     Rate and Rhythm: Normal rate.     Pulses: Normal pulses.     Heart sounds: No murmur heard.    No friction rub. No gallop.  Pulmonary:     Effort: Pulmonary effort is normal.     Breath sounds: Normal breath sounds. No stridor. No wheezing or rhonchi.  Chest:     Chest wall: No tenderness.  Abdominal:     General: There is no distension.     Palpations: Abdomen is soft. There is no mass.     Tenderness: There is no abdominal tenderness. There is no guarding.     Hernia: No hernia is present.  Musculoskeletal:        General: Normal range of motion.     Cervical back: Normal range of motion.  Skin:    General: Skin is warm and dry.     Capillary Refill: Capillary refill takes  less than 2 seconds.     Coloration: Skin is not jaundiced or pale.     Findings: No erythema or rash.  Neurological:     General: No focal deficit present.     Mental Status: He is alert.  Psychiatric:        Mood and Affect: Mood normal.        Behavior: Behavior normal.        Thought Content: Thought content normal.     Data: Lab Results  Component Value Date   WBC 6.4 09/22/2023   HGB 7.1 (L) 09/22/2023   HCT 20.9 (L) 09/22/2023   MCV 96.3 09/22/2023  PLT 210 09/22/2023   Recent Labs  Lab 09/21/23 2221 09/22/23 0219 09/22/23 0459  HGB 6.4* 6.4* 7.1*   Lab Results  Component Value Date   NA 133 (L) 09/22/2023   K 4.1 09/22/2023   CL 104 09/22/2023   CO2 20 (L) 09/22/2023   BUN 17 09/22/2023   CREATININE 1.18 09/22/2023   GLU 106 12/28/2012   Lab Results  Component Value Date   ALT 23 09/21/2023   AST 22 09/21/2023   ALKPHOS 58 09/21/2023   BILITOT 0.5 09/21/2023   Recent Labs  Lab 09/21/23 1843 09/21/23 1844  APTT 28  --   INR  --  1.0      Latest Ref Rng & Units 09/22/2023    4:59 AM 09/22/2023    2:19 AM 09/21/2023   10:21 PM  CBC  WBC 4.0 - 10.5 K/uL 6.4  7.7  7.7   Hemoglobin 13.0 - 17.0 g/dL 7.1  6.4  6.4   Hematocrit 39.0 - 52.0 % 20.9  19.7  19.7   Platelets 150 - 400 K/uL 210  252  261     STUDIES: CT Head Wo Contrast Result Date: 09/21/2023 CLINICAL DATA:  Mental status change, unknown cause. Left leg numbness. EXAM: CT HEAD WITHOUT CONTRAST TECHNIQUE: Contiguous axial images were obtained from the base of the skull through the vertex without intravenous contrast. RADIATION DOSE REDUCTION: This exam was performed according to the departmental dose-optimization program which includes automated exposure control, adjustment of the mA and/or kV according to patient size and/or use of iterative reconstruction technique. COMPARISON:  None Available. FINDINGS: Brain: There is no evidence of an acute cortically based infarct intracranial hemorrhage,  mass, midline shift, or extra-axial fluid collection. Hypodensities in the cerebral white matter bilaterally are nonspecific but compatible with mild chronic small vessel ischemic disease. A 1.3 cm right thalamocapsular infarct has a chronic appearance. A left internal capsule lacunar infarct is also presumably chronic with this history. The ventricles are normal in size. Vascular: Calcified atherosclerosis at the skull base. No hyperdense vessel. Skull: No acute fracture or suspicious osseous lesion. Sinuses/Orbits: Mild mucosal thickening in the included portions of the paranasal sinuses. Clear mastoid air cells. Partially visualized anterior nasal septal perforation. Unremarkable orbits. Other: None. IMPRESSION: 1. No evidence of acute large territory infarct or intracranial hemorrhage. 2. Chronic small-vessel ischemic disease with lacunar infarcts as above. Electronically Signed   By: Dasie Hamburg M.D.   On: 09/21/2023 20:16   DG Chest 2 View Result Date: 09/21/2023 CLINICAL DATA:  Shortness of breath. EXAM: CHEST - 2 VIEW COMPARISON:  March 15, 2011. FINDINGS: The heart size and mediastinal contours are within normal limits. Nodular density is noted in right lung base which may simply represent overlying nipple shadow. No consolidative process is noted. The visualized skeletal structures are unremarkable. IMPRESSION: Nodular density seen over right lung base which may represent overlying nipple shadow. Repeat radiograph with nipple markers is recommended to rule out pulmonary nodule. No other abnormality seen. Electronically Signed   By: Lynwood Landy Raddle M.D.   On: 09/21/2023 18:00   @IMAGES @  Assessment:  1.  Melena-reported-differential diagnosis clues peptic ulcer disease, right colonic bleed, small bowel bleed, gastric and/or esophageal variceal bleeding. 2.  Alcohol abuse-INR another lead indices liver function appear to be normal. 3.  Anemia secondary to GI blood loss-patient apparently has had at  least 1 unit transfused.  Admitting hemoglobin was 7.2 down from a baseline of about 12. 4.  History of hypertension. 5.  History of type 2 diabetes mellitus-untreated    Recommendations:  1.  Continue NPO. 2.  Serial H&H and transfuse as necessary. 3.  IV acid suppression. 4.  EGD today.The patient understands the nature of the planned procedure, indications, risks, alternatives and potential complications including but not limited to bleeding, infection, perforation, damage to internal organs and possible oversedation/side effects from anesthesia. The patient agrees and gives consent to proceed.  Please refer to procedure notes for findings, recommendations and patient disposition/instructions.   Thank you for the consult. Please call with questions or concerns.  Aundria Ladell Eck MD [facility] [address] [place], [place] [address] [phone]  09/22/2023 8:02 AM

## 2023-09-22 NOTE — Interval H&P Note (Signed)
 History and Physical Interval Note:  09/22/2023 1:17 PM  Omar Watson  has presented today for surgery, with the diagnosis of Anemia from GI blood loss, melena.  The various methods of treatment have been discussed with the patient and family. After consideration of risks, benefits and other options for treatment, the patient has consented to  Procedure(s): ESOPHAGOGASTRODUODENOSCOPY (EGD) (N/A) as a surgical intervention.  The patient's history has been reviewed, patient examined, no change in status, stable for surgery.  I have reviewed the patient's chart and labs.  Questions were answered to the patient's satisfaction.     Sullivan, Klayton Monie

## 2023-09-22 NOTE — H&P (View-Only) (Signed)
 [facility] GI Inpatient Consult Note   Ladell Francis Boss, M.D.  Reason for Consult: melena, anemia secondary to gastrointestinal blood loss   Attending Requesting Consult: Amaryllis Dare, M.D.  Outpatient Primary Physician: Nancyann Perry, M.D.  History of Present Illness: Omar Watson is a 68 y.o. male with a reported history of alcohol abuse who presents to the emergency room after symptoms of weakness and subjective confusion.  The patient appears alert and oriented at this time although his speech is somewhat slowed.  Report was that the patient had a few black stools over the last week which accompanied his progressive weakness.  Patient at this time denies that he mention bleeding to anyone although stool sample was obtained that was reportedly melenic in gross appearance and was occult positive.  Patient denies any abdominal pain, chest pain, shortness of breath, dyspnea on exertion or cough.  He does take Aleve 2-3 times a week for chronic back pain and headaches.  He lost his insurance when he retired from active work 3 years ago.  For this reason, he no longer takes prescribed medicine for type 2 diabetes mellitus and hypertension.  He reports drinking 6 beers daily on a regular basis.  He says last alcoholic beverage was 3 days ago. Patient has remote history of colonoscopy at the age of 61 at which time polyps were found and removed.  He was supposed to follow-up with surveillance colonoscopy but admits that he never did follow-up with his gastroenterologist at the time. From an upper GI standpoint the patient denies any dysphagia, anorexia, involuntary weight loss, hematemesis or frequent GERD symptoms.  From a neurologic standpoint, patient denies any history of seizure or stroke or movement disorder. Patient denies any hematuria, pyuria, urinary hesitancy from a urologic standpoint.   Past Medical History:  Past Medical History:  Diagnosis Date   Gout    History  of chicken pox    History of measles    History of mumps     Problem List: Patient Active Problem List   Diagnosis Date Noted   Acute blood loss anemia 09/22/2023   Hypophosphatemia 09/22/2023   GI bleeding 09/21/2023   HLD (hyperlipidemia) 09/21/2023   Diabetes mellitus without complication (HCC) 09/21/2023   Tobacco abuse 09/21/2023   Alcohol abuse 09/21/2023   AKI (acute kidney injury) (HCC) 09/21/2023   Peripheral neuropathy 09/21/2023   Hyponatremia 09/21/2023   Vitamin B12 deficiency anemia 05/08/2019   Diabetes (HCC) 04/05/2018   Excessive drinking alcohol 04/05/2018   DOE (dyspnea on exertion) 10/22/2015   ED (erectile dysfunction) of organic origin 10/22/2015   GERD (gastroesophageal reflux disease) 10/22/2015   Hypertriglyceridemia 10/22/2015   Hyperuricemia 10/22/2015   Smoking greater than 30 pack years 10/22/2015   Insomnia 06/03/2009   Essential (primary) hypertension 02/24/2009   Gout 02/04/2009   History of adenomatous polyp of colon 01/01/2009    Past Surgical History: Past Surgical History:  Procedure Laterality Date   APPENDECTOMY  1969    Allergies: Allergies  Allergen Reactions   Lisinopril  Cough   Penicillins     Home Medications: (Not in a hospital admission)  Home medication reconciliation was completed with the patient.   Scheduled Inpatient Medications:    folic acid   1 mg Oral Daily   gabapentin   100 mg Oral QHS   insulin  aspart  0-5 Units Subcutaneous QHS   insulin  aspart  0-9 Units Subcutaneous TID WC   LORazepam   0-4 mg Intravenous Q6H   Followed  by   NOREEN ON 09/23/2023] LORazepam   0-4 mg Intravenous Q12H   multivitamin with minerals  1 tablet Oral Daily   nicotine   21 mg Transdermal Daily   pantoprazole  (PROTONIX ) IV  40 mg Intravenous Q12H   sodium chloride   1 g Oral BID WC   thiamine   100 mg Oral Daily   Or   thiamine   100 mg Intravenous Daily    Continuous Inpatient Infusions:    sodium chloride  75 mL/hr at 09/21/23  2257   sodium chloride       PRN Inpatient Medications:  acetaminophen , hydrALAZINE , LORazepam  **OR** LORazepam , ondansetron  (ZOFRAN ) IV, zolpidem   Family History: family history includes Alzheimer's disease in his mother; Heart Problems in his father; Heart attack in his father.   GI Family History: Negative  Social History:   reports that he has been smoking cigarettes. He has a 63 pack-year smoking history. He has never used smokeless tobacco. He reports current alcohol use. He reports that he does not use drugs. The patient denies ETOH, tobacco, or drug use.    Review of Systems: Review of Systems - Negative except HPI  Physical Examination: BP (!) 185/75 (BP Location: Right Arm)   Pulse 74   Temp 98.3 F (36.8 C) (Oral)   Resp 18   Ht 5' 7 (1.702 m)   Wt 70.3 kg   SpO2 100%   BMI 24.28 kg/m  Physical Exam Constitutional:      General: He is not in acute distress.    Appearance: He is ill-appearing. He is not toxic-appearing or diaphoretic.  HENT:     Head: Normocephalic and atraumatic.     Nose: Nose normal.     Mouth/Throat:     Mouth: Mucous membranes are dry.  Eyes:     General: No scleral icterus.    Pupils: Pupils are equal, round, and reactive to light.  Cardiovascular:     Rate and Rhythm: Normal rate.     Pulses: Normal pulses.     Heart sounds: No murmur heard.    No friction rub. No gallop.  Pulmonary:     Effort: Pulmonary effort is normal.     Breath sounds: Normal breath sounds. No stridor. No wheezing or rhonchi.  Chest:     Chest wall: No tenderness.  Abdominal:     General: There is no distension.     Palpations: Abdomen is soft. There is no mass.     Tenderness: There is no abdominal tenderness. There is no guarding.     Hernia: No hernia is present.  Musculoskeletal:        General: Normal range of motion.     Cervical back: Normal range of motion.  Skin:    General: Skin is warm and dry.     Capillary Refill: Capillary refill takes  less than 2 seconds.     Coloration: Skin is not jaundiced or pale.     Findings: No erythema or rash.  Neurological:     General: No focal deficit present.     Mental Status: He is alert.  Psychiatric:        Mood and Affect: Mood normal.        Behavior: Behavior normal.        Thought Content: Thought content normal.     Data: Lab Results  Component Value Date   WBC 6.4 09/22/2023   HGB 7.1 (L) 09/22/2023   HCT 20.9 (L) 09/22/2023   MCV 96.3 09/22/2023  PLT 210 09/22/2023   Recent Labs  Lab 09/21/23 2221 09/22/23 0219 09/22/23 0459  HGB 6.4* 6.4* 7.1*   Lab Results  Component Value Date   NA 133 (L) 09/22/2023   K 4.1 09/22/2023   CL 104 09/22/2023   CO2 20 (L) 09/22/2023   BUN 17 09/22/2023   CREATININE 1.18 09/22/2023   GLU 106 12/28/2012   Lab Results  Component Value Date   ALT 23 09/21/2023   AST 22 09/21/2023   ALKPHOS 58 09/21/2023   BILITOT 0.5 09/21/2023   Recent Labs  Lab 09/21/23 1843 09/21/23 1844  APTT 28  --   INR  --  1.0      Latest Ref Rng & Units 09/22/2023    4:59 AM 09/22/2023    2:19 AM 09/21/2023   10:21 PM  CBC  WBC 4.0 - 10.5 K/uL 6.4  7.7  7.7   Hemoglobin 13.0 - 17.0 g/dL 7.1  6.4  6.4   Hematocrit 39.0 - 52.0 % 20.9  19.7  19.7   Platelets 150 - 400 K/uL 210  252  261     STUDIES: CT Head Wo Contrast Result Date: 09/21/2023 CLINICAL DATA:  Mental status change, unknown cause. Left leg numbness. EXAM: CT HEAD WITHOUT CONTRAST TECHNIQUE: Contiguous axial images were obtained from the base of the skull through the vertex without intravenous contrast. RADIATION DOSE REDUCTION: This exam was performed according to the departmental dose-optimization program which includes automated exposure control, adjustment of the mA and/or kV according to patient size and/or use of iterative reconstruction technique. COMPARISON:  None Available. FINDINGS: Brain: There is no evidence of an acute cortically based infarct intracranial hemorrhage,  mass, midline shift, or extra-axial fluid collection. Hypodensities in the cerebral white matter bilaterally are nonspecific but compatible with mild chronic small vessel ischemic disease. A 1.3 cm right thalamocapsular infarct has a chronic appearance. A left internal capsule lacunar infarct is also presumably chronic with this history. The ventricles are normal in size. Vascular: Calcified atherosclerosis at the skull base. No hyperdense vessel. Skull: No acute fracture or suspicious osseous lesion. Sinuses/Orbits: Mild mucosal thickening in the included portions of the paranasal sinuses. Clear mastoid air cells. Partially visualized anterior nasal septal perforation. Unremarkable orbits. Other: None. IMPRESSION: 1. No evidence of acute large territory infarct or intracranial hemorrhage. 2. Chronic small-vessel ischemic disease with lacunar infarcts as above. Electronically Signed   By: Dasie Hamburg M.D.   On: 09/21/2023 20:16   DG Chest 2 View Result Date: 09/21/2023 CLINICAL DATA:  Shortness of breath. EXAM: CHEST - 2 VIEW COMPARISON:  March 15, 2011. FINDINGS: The heart size and mediastinal contours are within normal limits. Nodular density is noted in right lung base which may simply represent overlying nipple shadow. No consolidative process is noted. The visualized skeletal structures are unremarkable. IMPRESSION: Nodular density seen over right lung base which may represent overlying nipple shadow. Repeat radiograph with nipple markers is recommended to rule out pulmonary nodule. No other abnormality seen. Electronically Signed   By: Lynwood Landy Raddle M.D.   On: 09/21/2023 18:00   @IMAGES @  Assessment:  1.  Melena-reported-differential diagnosis clues peptic ulcer disease, right colonic bleed, small bowel bleed, gastric and/or esophageal variceal bleeding. 2.  Alcohol abuse-INR another lead indices liver function appear to be normal. 3.  Anemia secondary to GI blood loss-patient apparently has had at  least 1 unit transfused.  Admitting hemoglobin was 7.2 down from a baseline of about 12. 4.  History of hypertension. 5.  History of type 2 diabetes mellitus-untreated    Recommendations:  1.  Continue NPO. 2.  Serial H&H and transfuse as necessary. 3.  IV acid suppression. 4.  EGD today.The patient understands the nature of the planned procedure, indications, risks, alternatives and potential complications including but not limited to bleeding, infection, perforation, damage to internal organs and possible oversedation/side effects from anesthesia. The patient agrees and gives consent to proceed.  Please refer to procedure notes for findings, recommendations and patient disposition/instructions.   Thank you for the consult. Please call with questions or concerns.  Aundria Ladell Eck MD [facility] [address] [place], [place] [address] [phone]  09/22/2023 8:02 AM

## 2023-09-22 NOTE — Op Note (Signed)
 [facility] Patient Name: Omar Watson Procedure Date: 09/22/2023 1:26 PM MRN: [id number] Account #: [id number] Date of Birth: 05/26/1956 Admit Type: Inpatient Age: 68 Room: [facility] ENDO ROOM 1 Gender: Male Note Status: Finalized Instrument Name: Upper Endoscope [id number] Procedure:             Upper GI endoscopy Indications:           Iron deficiency anemia secondary to chronic blood                         loss, Melena Providers:             Jericca Russett K. Aundria MD, MD Referring MD:          No Local Md, MD (Referring MD) Medicines:             Propofol  per Anesthesia Complications:         No immediate complications. Estimated blood loss: None. Procedure:             Pre-Anesthesia Assessment:                        - The risks and benefits of the procedure and the                         sedation options and risks were discussed with the                         patient. All questions were answered and informed                         consent was obtained.                        - Patient identification and proposed procedure were                         verified prior to the procedure by the nurse. The                         procedure was verified in the procedure room.                        - ASA Grade Assessment: III - A patient with severe                         systemic disease.                        - After reviewing the risks and benefits, the patient                         was deemed in satisfactory condition to undergo the                         procedure.                        After obtaining informed consent, the endoscope was  passed under direct vision. Throughout the procedure,                         the patient's blood pressure, pulse, and oxygen                         saturations were monitored continuously. The Endoscope                         was introduced through the mouth, and advanced to the                          third part of duodenum. The upper GI endoscopy was                         accomplished without difficulty. The patient tolerated                         the procedure well. Findings:      LA Grade A (one or more mucosal breaks less than 5 mm, not extending       between tops of 2 mucosal folds) esophagitis with no bleeding was found       in the distal esophagus.      Patchy minimal inflammation characterized by erosions and erythema was       found in the gastric antrum and in the prepyloric region of the stomach.       Biopsies were taken with a cold forceps for Helicobacter pylori testing.      The cardia and gastric fundus were normal on retroflexion.      One non-bleeding cratered gastric ulcer with no stigmata of bleeding was       found in the gastric antrum. The lesion was 20 mm in largest dimension.      The exam of the stomach was otherwise normal.      Patchy mildly congested mucosa without active bleeding and with no       stigmata of bleeding was found in the duodenal bulb.      The second portion of the duodenum and third portion of the duodenum       were normal. Impression:            - LA Grade A chronic esophagitis with no bleeding.                        - Gastritis. Biopsied.                        - Non-bleeding gastric ulcer with no stigmata of                         bleeding.                        - Congested duodenal mucosa.                        - Normal second portion of the duodenum and third                         portion of the duodenum. Recommendation:        -  Return patient to hospital ward for ongoing care.                        - Use Protonix  (pantoprazole ) 40 mg PO BID.                        - No aspirin, ibuprofen, naproxen, or other                         non-steroidal anti-inflammatory drugs.                        - Repeat upper endoscopy in 3 months to evaluate the                         response to therapy.                         - Return to my office in 1 month.                        - Advance diet as tolerated.                        - [employer] will sign off for now. Call us  back if any                         changes clinically or if we can help for any reason. Procedure Code(s):     --- Professional ---                        [phone], Esophagogastroduodenoscopy, flexible,                         transoral; with biopsy, single or multiple Diagnosis Code(s):     --- Professional ---                        K92.1, Melena (includes Hematochezia)                        D50.0, Iron deficiency anemia secondary to blood loss                         (chronic)                        K31.89, Other diseases of stomach and duodenum                        K25.9, Gastric ulcer, unspecified as acute or chronic,                         without hemorrhage or perforation                        K29.70, Gastritis, unspecified, without bleeding                        K20.90, Esophagitis, unspecified without bleeding CPT copyright 2022 American Medical Association. All rights reserved. The codes documented in  this report are preliminary and upon coder review may  be revised to meet current compliance requirements. Ladell MARLA Boss MD, MD 09/22/2023 1:54:28 PM This report has been signed electronically. Number of Addenda: 0 Note Initiated On: 09/22/2023 1:26 PM Estimated Blood Loss:  Estimated blood loss: none.      [facility]

## 2023-09-22 NOTE — Assessment & Plan Note (Signed)
 Improving, sodium currently at 133.  History of alcohol abuse which can be contributory. -Continue with salt tablet

## 2023-09-22 NOTE — Assessment & Plan Note (Signed)
 Blood pressure elevated above 180 systolic this morning.  Patient used to take ARB and amlodipine but has stopped taking all the medications for the last couple of year. -Restarting amlodipine -Continue to monitor

## 2023-09-22 NOTE — Assessment & Plan Note (Signed)
 Tobacco abuse. -Continue with nicotine patch -CIWA protocol Counseling was provided

## 2023-09-22 NOTE — ED Notes (Signed)
 Blood started, pt notified to let RN if becomes symptomatic.

## 2023-09-22 NOTE — Assessment & Plan Note (Signed)
 Follow up with PCP

## 2023-09-22 NOTE — Assessment & Plan Note (Signed)
 Patient reports tingling and numbness in all distal extremities -Start Neurontin 100 mg nightly. B12 at 382-goal is above 400.  TSH normal

## 2023-09-22 NOTE — Progress Notes (Signed)
 Progress Note   Patient: Omar Watson FMW:[id number] DOB: [birth year] DOA: 09/21/2023     0 DOS: the patient was seen and examined on 09/22/2023   Brief hospital course: Taken from H&P.   Amilcar JAYKE CAUL is a 68 y.o. male with medical history significant of a large precancerous colon polyp removal 2010 (lost follow-up), internal hemorrhoid, tobacco and alcohol abuse, HTN, HLD, DM, gout, gout, medication noncompliance, presents with dark stool.  History of several melanotic stools over the last couple of week.  Patient takes Aleve 2-3 times weekly for chronic back pain and headaches.  He had a colonoscopy at the age of 60 if and at that time polyps were found and removed, no follow-up afterwards.  On presentation he was found to have a hemoglobin of 6.4 with baseline appears to be around 12, BUN 16, creatinine 1.32,negative PCR for COVID, flu and RSV, sodium 128, phosphorus 2.2, magnesium  2.5. CXR with nodular density seen over right lung base which may represent overlying nipple shadow. Repeat radiograph with nipple markers is recommended to rule out pulmonary nodule. No other abnormality seen.  Patient received normal saline bolus, ordered 1 unit of PRBC and GI was consulted.  1/3: Blood pressure elevated this morning at 185/75.  Patient was not taking his home antihypertensives for a while.  Restarted amlodipine .  Hemoglobin 7.1, sodium improved to 133, CO2 20, creatinine 1.18.  Anemia panel with borderline low iron saturation and others, B12 at 323 with goal of about 400. Ordered 1 more unit of PRBC.  Going for EGD today.     Assessment and Plan: * GI bleeding Patient having melanotic stool.  History of NSAID use. Have not seen a physician or follow-up colonoscopy since age 70. GI was consulted and going for EGD today. Anemia panel with borderline low iron saturation and others, B12 at 323 with goal of about 400. Hemoglobin at 7.1 today s/p 1 unit of PRBC. -Giving 1 more  unit -Continue to monitor hemoglobin -Continue with supportive care and IV PPI -Follow-up postprocedure recommendations  Essential (primary) hypertension Blood pressure elevated above 180 systolic this morning.  Patient used to take ARB and amlodipine  but has stopped taking all the medications for the last couple of year. -Restarting amlodipine  -Continue to monitor  AKI (acute kidney injury) (HCC) Creatinine improving, currently at 1.18.  Baseline was less than 1, 4 years ago. -Monitor renal function -Avoid nephrotoxins  GERD (gastroesophageal reflux disease) -Patient is on IV Protonix   Hyponatremia Improving, sodium currently at 133.  History of alcohol abuse which can be contributory. -Continue with salt tablet  HLD (hyperlipidemia) Follow-up with PCP  Diabetes mellitus without complication (HCC) Has not seen physician for many years and not taking his medications.  Prior A1c of 7.9  4 years ago. CBG within goal -Check A1c -Continue with SSI  Peripheral neuropathy  Patient reports tingling and numbness in all distal extremities -Start Neurontin  100 mg nightly. B12 at 382-goal is above 400.  TSH normal  Alcohol abuse Tobacco abuse. -Continue with nicotine  patch -CIWA protocol Counseling was provided  Hypophosphatemia Improving with repletion. -Continue to monitor   Subjective: Patient was seen and examined today.  No new concern.  Did not had any bowel movement since morning.  Physical Exam: Vitals:   09/22/23 1051 09/22/23 1137 09/22/23 1230 09/22/23 1311  BP: (!) 152/68 (!) 172/77 (!) 157/70 (!) 174/68  Pulse: 72 73 76 82  Resp: 12 18 15 12   Temp: 97.9 F (36.6 C) 97.8 F (  36.6 C) 97.6 F (36.4 C) (!) 96.7 F (35.9 C)  TempSrc: Oral Oral Oral Temporal  SpO2: 100% 100% 100% 100%  Weight:      Height:       General.  Well-developed gentleman, in no acute distress. Pulmonary.  Lungs clear bilaterally, normal respiratory effort. CV.  Regular rate and  rhythm, no JVD, rub or murmur. Abdomen.  Soft, nontender, nondistended, BS positive. CNS.  Alert and oriented .  No focal neurologic deficit. Extremities.  No edema, no cyanosis, pulses intact and symmetrical. Psychiatry.  Judgment and insight appears normal.   Data Reviewed: Prior data reviewed  Family Communication: Discussed with patient  Disposition: Status is: Observation The patient will require care spanning > 2 midnights and should be moved to inpatient because: Severity of illness  Planned Discharge Destination: Home  DVT prophylaxis.  SCDs Time spent: 45 minutes  This record has been created using Conservation officer, historic buildings. Errors have been sought and corrected,but may not always be located. Such creation errors do not reflect on the standard of care.   Author: Amaryllis Dare, MD 09/22/2023 1:20 PM  For on call review [email or web address].

## 2023-09-22 NOTE — Anesthesia Preprocedure Evaluation (Addendum)
 Anesthesia Evaluation  Patient identified by MRN, date of birth, ID band Patient awake    Reviewed: Allergy & Precautions, NPO status , Patient's Chart, lab work & pertinent test results  History of Anesthesia Complications Negative for: history of anesthetic complications  Airway Mallampati: III   Neck ROM: Full    Dental  (+) Missing   Pulmonary Current Smoker (2 ppd) and Patient abstained from smoking.   Pulmonary exam normal breath sounds clear to auscultation       Cardiovascular hypertension, Normal cardiovascular exam Rhythm:Regular Rate:Normal  ECG 09/21/23: ST (HR 106); nonspecific ST abnormality; no STEMI   Neuro/Psych Alcohol use disorder; 6 beers daily; last intake 09/19/22    GI/Hepatic ,GERD  ,,  Endo/Other  diabetes, Type 2    Renal/GU      Musculoskeletal Gout    Abdominal   Peds  Hematology  (+) Blood dyscrasia, anemia   Anesthesia Other Findings   Reproductive/Obstetrics                             Anesthesia Physical Anesthesia Plan  ASA: 3  Anesthesia Plan: General   Post-op Pain Management:    Induction: Intravenous  PONV Risk Score and Plan: 1 and Propofol  infusion, TIVA and Treatment may vary due to age or medical condition  Airway Management Planned: Natural Airway  Additional Equipment:   Intra-op Plan:   Post-operative Plan:   Informed Consent: I have reviewed the patients History and Physical, chart, labs and discussed the procedure including the risks, benefits and alternatives for the proposed anesthesia with the patient or authorized representative who has indicated his/her understanding and acceptance.       Plan Discussed with: CRNA  Anesthesia Plan Comments: (LMA/GETA backup discussed.  Patient consented for risks of anesthesia including but not limited to:  - adverse reactions to medications - damage to eyes, teeth, lips or other oral  mucosa - nerve damage due to positioning  - sore throat or hoarseness - damage to heart, brain, nerves, lungs, other parts of body or loss of life  Informed patient about role of CRNA in peri- and intra-operative care.  Patient voiced understanding.)        Anesthesia Quick Evaluation

## 2023-09-23 LAB — TYPE AND SCREEN
ABO/RH(D): A POS
Antibody Screen: NEGATIVE
Unit division: 0
Unit division: 0

## 2023-09-23 LAB — BPAM RBC
Blood Product Expiration Date: 202501282359
Blood Product Expiration Date: 202501282359
ISSUE DATE / TIME: 202501030050
ISSUE DATE / TIME: 202501031030
Unit Type and Rh: 6200
Unit Type and Rh: 6200

## 2023-09-23 LAB — GLUCOSE, CAPILLARY
Glucose-Capillary: 124 mg/dL — ABNORMAL HIGH (ref 70–99)
Glucose-Capillary: 153 mg/dL — ABNORMAL HIGH (ref 70–99)

## 2023-09-23 LAB — HEMOGLOBIN A1C
Hgb A1c MFr Bld: 6.1 % — ABNORMAL HIGH (ref 4.8–5.6)
Mean Plasma Glucose: 128 mg/dL

## 2023-09-23 MED ORDER — METFORMIN HCL ER 500 MG PO TB24: 500.0000 mg | ORAL_TABLET | Freq: Two times a day (BID) | ORAL | 2 refills | Status: AC

## 2023-09-23 MED ORDER — PANTOPRAZOLE SODIUM 40 MG PO TBEC: 40.0000 mg | DELAYED_RELEASE_TABLET | Freq: Two times a day (BID) | ORAL | Status: DC

## 2023-09-23 MED ORDER — ESOMEPRAZOLE MAGNESIUM 40 MG PO CPDR: 40.0000 mg | DELAYED_RELEASE_CAPSULE | Freq: Two times a day (BID) | ORAL | 1 refills | Status: AC

## 2023-09-23 MED ORDER — ROSUVASTATIN CALCIUM 10 MG PO TABS: 10.0000 mg | ORAL_TABLET | Freq: Every day | ORAL | 2 refills | Status: AC

## 2023-09-23 MED ORDER — FE FUM-VIT C-VIT B12-FA 460-60-0.01-1 MG PO CAPS: 1.0000 | ORAL_CAPSULE | Freq: Two times a day (BID) | ORAL | 1 refills | Status: AC

## 2023-09-23 MED ORDER — GABAPENTIN 100 MG PO CAPS: 100.0000 mg | ORAL_CAPSULE | Freq: Every day | ORAL | 2 refills | Status: AC

## 2023-09-23 MED ORDER — AMLODIPINE BESYLATE 10 MG PO TABS: 10.0000 mg | ORAL_TABLET | Freq: Every day | ORAL | 2 refills | Status: DC

## 2023-09-23 MED ORDER — ADULT MULTIVITAMIN W/MINERALS CH: 1.0000 | ORAL_TABLET | Freq: Every day | ORAL | 0 refills | Status: AC

## 2023-09-23 NOTE — Consult Note (Signed)
 PHARMACIST - PHYSICIAN COMMUNICATION  DR: Amaryllis Dare, MD  CONCERNING: IV to Oral Route Change Policy  RECOMMENDATION: This patient is receiving pantoprazole  by the intravenous route.  Based on criteria approved by the Pharmacy and Therapeutics Committee, the intravenous medication(s) is/are being converted to the equivalent oral dose form(s).   DESCRIPTION: These criteria include: The patient is eating (either orally or via tube) and/or has been taking other orally administered medications for a least 24 hours The patient has no evidence of active gastrointestinal bleeding or impaired GI absorption (gastrectomy, short bowel, patient on TNA or NPO).  If you have questions about this conversion, please contact the Pharmacy Department  []   [phone] )  Omar Watson [x]   [phone] )  Omar Watson []   [phone] )  Omar Watson []   [phone] )  Omar Watson []   [phone] )  Omar Watson   Omar Watson, PharmD Clinical Pharmacist 09/23/2023 10:41 AM

## 2023-09-23 NOTE — Plan of Care (Signed)

## 2023-09-23 NOTE — Plan of Care (Signed)

## 2023-09-23 NOTE — Discharge Summary (Signed)
 Physician Discharge Summary   Patient: Omar Watson MRN: [id number] DOB: 09-28-1955  Admit date:     09/21/2023  Discharge date: 09/23/23  Discharge Physician: Amaryllis Dare   PCP: Gasper Nancyann BRAVO, MD   Recommendations at discharge:  Please obtain CBC and BMP and follow-up Patient is due for his routine screening Follow-up with primary care provider Follow-up with gastroenterology.  Discharge Diagnoses: Principal Problem:   GI bleeding Active Problems:   Acute blood loss anemia   Essential (primary) hypertension   GERD (gastroesophageal reflux disease)   AKI (acute kidney injury) (HCC)   HLD (hyperlipidemia)   Hyponatremia   Diabetes mellitus without complication (HCC)   Peripheral neuropathy   Tobacco abuse   Alcohol abuse   Hypophosphatemia   Anemia   Melena   Paresthesia   Hospital Course: Taken from H&P.   Omar Watson is a 68 y.o. male with medical history significant of a large precancerous colon polyp removal 2010 (lost follow-up), internal hemorrhoid, tobacco and alcohol abuse, HTN, HLD, DM, gout,  medication noncompliance, presents with dark stool.  History of several melanotic stools over the last couple of week.  Patient takes Aleve 2-3 times weekly for chronic back pain and headaches.  He had a colonoscopy at the age of 3 if and at that time polyps were found and removed, no follow-up afterwards.  On presentation he was found to have a hemoglobin of 6.4 with baseline appears to be around 12, BUN 16, creatinine 1.32,negative PCR for COVID, flu and RSV, sodium 128, phosphorus 2.2, magnesium  2.5. CXR with nodular density seen over right lung base which may represent overlying nipple shadow. Repeat radiograph with nipple markers is recommended to rule out pulmonary nodule. No other abnormality seen.  Patient received normal saline bolus, ordered 1 unit of PRBC and GI was consulted.  1/3: Blood pressure elevated this morning at 185/75.  Patient was not  taking his home antihypertensives for a while.  Restarted amlodipine .  Hemoglobin 7.1, sodium improved to 133, CO2 20, creatinine 1.18.  Anemia panel with borderline low iron saturation and others, B12 at 323 with goal of about 400. Ordered 1 more unit of PRBC.  Going for EGD today.  1/4: Blood pressure mildly elevated at 155/81.  Hemoglobin stable at 9.8.  Had 1 more small melanotic stool.  EGD yesterday with some erosive gastritis, likely with NSAID use.  No active bleeding.  Biopsies were taken for H. pylori with pending results.  GI is recommending twice daily PPI for which he was ordered Nexium  twice daily and need to have a close follow-up with gastroenterology for biopsy results and a likely colonoscopy as he had his prior colonoscopy done at the age of 73 and need reevaluation now.  A1c was found to be at 6.1, which makes him in prediabetic range.  Patient used to take metformin  before so it was restarted.  He was also started on amlodipine  and might need another agent for which his PCP can monitor and treat accordingly.  Patient was told to avoid NSAID and use Tylenol  as needed for pain.  Patient is being discharged on Nexium  40 mg twice daily, amlodipine , iron supplement and metformin .  New prescriptions were provided.  Patient need to follow-up with his primary care provider and gastroenterologist for further management.   Assessment and Plan: * GI bleeding Patient having melanotic stool.  History of NSAID use. Have not seen a physician or follow-up colonoscopy since age 89. GI was consulted and underwent  EGD which shows some erosive gastritis, biopsies were taken for H. pylori. Anemia panel with borderline low iron saturation and others, B12 at 323 with goal of about 400. Received 2 unit of PRBC and hemoglobin improved to 9.8.  Essential (primary) hypertension Blood pressure elevated .Patient used to take ARB and amlodipine  but has stopped taking all the medications for the last  couple of year. -Restarting amlodipine  -Continue to monitor  AKI (acute kidney injury) (HCC) Creatinine improving, currently at 1.18.  Baseline was less than 1, 4 years ago. -Monitor renal function -Avoid nephrotoxins  GERD (gastroesophageal reflux disease) -Patient is on IV Protonix   Hyponatremia Improving, sodium currently at 133.  History of alcohol abuse which can be contributory. -Received salt tablets while in the hospital.  Counseling was provided for alcohol cessation.  HLD (hyperlipidemia) Follow-up with PCP  Diabetes mellitus without complication (HCC) Has not seen physician for many years and not taking his medications.  Prior A1c of 7.9  4 years ago. CBG within goal -A1c of 6.1  Peripheral neuropathy  Patient reports tingling and numbness in all distal extremities -Start Neurontin  100 mg nightly. B12 at 382-goal is above 400.  TSH normal  Alcohol abuse Tobacco abuse. -Continue with nicotine  patch -CIWA protocol Counseling was provided  Hypophosphatemia Improving with repletion. -Continue to monitor  Consultants: Gastroenterology Procedures performed: EGD Disposition: Home Diet recommendation:  Discharge Diet Orders (From admission, onward)     Start     Ordered   09/23/23 0000  Diet - low sodium heart healthy        09/23/23 1047           Carb modified diet DISCHARGE MEDICATION: Allergies as of 09/23/2023       Reactions   Lisinopril  Cough   Penicillins         Medication List     STOP taking these medications    allopurinol  100 MG tablet Commonly known as: ZYLOPRIM    aspirin EC 81 MG tablet   cyanocobalamin  1000 MCG tablet Commonly known as: VITAMIN B12   glucose blood test strip Commonly known as: OneTouch Verio   omeprazole  20 MG capsule Commonly known as: PRILOSEC   sildenafil  20 MG tablet Commonly known as: REVATIO    triamcinolone  cream 0.1 % Commonly known as: KENALOG    valsartan -hydrochlorothiazide  80-12.5 MG  tablet Commonly known as: DIOVAN -HCT   zolpidem  10 MG tablet Commonly known as: AMBIEN        TAKE these medications    amLODipine  10 MG tablet Commonly known as: NORVASC  Take 1 tablet (10 mg total) by mouth daily.   esomeprazole  40 MG capsule Commonly known as: NexIUM  Take 1 capsule (40 mg total) by mouth 2 (two) times daily before a meal.   Fe Fum-Vit C-Vit B12-FA Caps capsule Commonly known as: TRIGELS-F FORTE Take 1 capsule by mouth 2 (two) times daily.   gabapentin  100 MG capsule Commonly known as: NEURONTIN  Take 1 capsule (100 mg total) by mouth at bedtime.   metFORMIN  500 MG 24 hr tablet Commonly known as: GLUCOPHAGE -XR Take 1 tablet (500 mg total) by mouth 2 (two) times daily with a meal. What changed: See the new instructions.   multivitamin with minerals Tabs tablet Take 1 tablet by mouth daily.   rosuvastatin  10 MG tablet Commonly known as: CRESTOR  Take 1 tablet (10 mg total) by mouth daily.        Follow-up Information     Fisher, Nancyann BRAVO, MD. Schedule an appointment as soon as possible for  a visit in 1 week(s).   Specialty: Family Medicine Contact information: [address] [place] [address] [phone]         [place]edo, Teodoro K, MD. Schedule an appointment as soon as possible for a visit in 1 week(s).   Specialty: Gastroenterology Contact information: [address] [place] [address] [phone]                Discharge Exam: Fredricka Weights   09/21/23 1507  Weight: 70.3 kg   General.  Well-developed gentleman, in no acute distress. Pulmonary.  Lungs clear bilaterally, normal respiratory effort. CV.  Regular rate and rhythm, no JVD, rub or murmur. Abdomen.  Soft, nontender, nondistended, BS positive. CNS.  Alert and oriented .  No focal neurologic deficit. Extremities.  No edema, no cyanosis, pulses intact and symmetrical. Psychiatry.  Judgment and insight appears normal.   Condition at  discharge: stable  The results of significant diagnostics from this hospitalization (including imaging, microbiology, ancillary and laboratory) are listed below for reference.   Imaging Studies: CT Head Wo Contrast Result Date: 09/21/2023 CLINICAL DATA:  Mental status change, unknown cause. Left leg numbness. EXAM: CT HEAD WITHOUT CONTRAST TECHNIQUE: Contiguous axial images were obtained from the base of the skull through the vertex without intravenous contrast. RADIATION DOSE REDUCTION: This exam was performed according to the departmental dose-optimization program which includes automated exposure control, adjustment of the mA and/or kV according to patient size and/or use of iterative reconstruction technique. COMPARISON:  None Available. FINDINGS: Brain: There is no evidence of an acute cortically based infarct intracranial hemorrhage, mass, midline shift, or extra-axial fluid collection. Hypodensities in the cerebral white matter bilaterally are nonspecific but compatible with mild chronic small vessel ischemic disease. A 1.3 cm right thalamocapsular infarct has a chronic appearance. A left internal capsule lacunar infarct is also presumably chronic with this history. The ventricles are normal in size. Vascular: Calcified atherosclerosis at the skull base. No hyperdense vessel. Skull: No acute fracture or suspicious osseous lesion. Sinuses/Orbits: Mild mucosal thickening in the included portions of the paranasal sinuses. Clear mastoid air cells. Partially visualized anterior nasal septal perforation. Unremarkable orbits. Other: None. IMPRESSION: 1. No evidence of acute large territory infarct or intracranial hemorrhage. 2. Chronic small-vessel ischemic disease with lacunar infarcts as above. Electronically Signed   By: Dasie Hamburg M.D.   On: 09/21/2023 20:16   DG Chest 2 View Result Date: 09/21/2023 CLINICAL DATA:  Shortness of breath. EXAM: CHEST - 2 VIEW COMPARISON:  March 15, 2011. FINDINGS: The heart  size and mediastinal contours are within normal limits. Nodular density is noted in right lung base which may simply represent overlying nipple shadow. No consolidative process is noted. The visualized skeletal structures are unremarkable. IMPRESSION: Nodular density seen over right lung base which may represent overlying nipple shadow. Repeat radiograph with nipple markers is recommended to rule out pulmonary nodule. No other abnormality seen. Electronically Signed   By: Lynwood Landy Raddle M.D.   On: 09/21/2023 18:00    Microbiology: Results for orders placed or performed during the hospital encounter of 09/21/23  Resp panel by RT-PCR (RSV, Flu A&B, Covid) Anterior Nasal Swab     Status: None   Collection Time: 09/21/23  6:44 PM   Specimen: Anterior Nasal Swab  Result Value Ref Range Status   SARS Coronavirus 2 by RT PCR NEGATIVE NEGATIVE Final    Comment: (NOTE) SARS-CoV-2 target nucleic acids are NOT DETECTED.  The SARS-CoV-2 RNA is generally  detectable in upper respiratory specimens during the acute phase of infection. The lowest concentration of SARS-CoV-2 viral copies this assay can detect is 138 copies/mL. A negative result does not preclude SARS-Cov-2 infection and should not be used as the sole basis for treatment or other patient management decisions. A negative result may occur with  improper specimen collection/handling, submission of specimen other than nasopharyngeal swab, presence of viral mutation(s) within the areas targeted by this assay, and inadequate number of viral copies(<138 copies/mL). A negative result must be combined with clinical observations, patient history, and epidemiological information. The expected result is Negative.  Fact Sheet for Patients:  bloggercourse.com  Fact Sheet for Healthcare Providers:  seriousbroker.it  This test is no t yet approved or cleared by the [place]  FDA and  has been  authorized for detection and/or diagnosis of SARS-CoV-2 by FDA under an Emergency Use Authorization (EUA). This EUA will remain  in effect (meaning this test can be used) for the duration of the COVID-19 declaration under Section 564(b)(1) of the Act, 21 U.S.C.section 360bbb-3(b)(1), unless the authorization is terminated  or revoked sooner.       Influenza A by PCR NEGATIVE NEGATIVE Final   Influenza B by PCR NEGATIVE NEGATIVE Final    Comment: (NOTE) The Xpert Xpress SARS-CoV-2/FLU/RSV plus assay is intended as an aid in the diagnosis of influenza from Nasopharyngeal swab specimens and should not be used as a sole basis for treatment. Nasal washings and aspirates are unacceptable for Xpert Xpress SARS-CoV-2/FLU/RSV testing.  Fact Sheet for Patients: bloggercourse.com  Fact Sheet for Healthcare Providers: seriousbroker.it  This test is not yet approved or cleared by the [place]  FDA and has been authorized for detection and/or diagnosis of SARS-CoV-2 by FDA under an Emergency Use Authorization (EUA). This EUA will remain in effect (meaning this test can be used) for the duration of the COVID-19 declaration under Section 564(b)(1) of the Act, 21 U.S.C. section 360bbb-3(b)(1), unless the authorization is terminated or revoked.     Resp Syncytial Virus by PCR NEGATIVE NEGATIVE Final    Comment: (NOTE) Fact Sheet for Patients: bloggercourse.com  Fact Sheet for Healthcare Providers: seriousbroker.it  This test is not yet approved or cleared by the [place]  FDA and has been authorized for detection and/or diagnosis of SARS-CoV-2 by FDA under an Emergency Use Authorization (EUA). This EUA will remain in effect (meaning this test can be used) for the duration of the COVID-19 declaration under Section 564(b)(1) of the Act, 21 U.S.C. section 360bbb-3(b)(1), unless the  authorization is terminated or revoked.  Performed at [facility], [address] [place], [place] [address]     Labs: CBC: Recent Labs  Lab 09/21/23 1536 09/21/23 2221 09/22/23 0219 09/22/23 0459 09/22/23 1447 09/22/23 2031  WBC 9.8 7.7 7.7 6.4 6.1 8.0  NEUTROABS 7.8*  --   --   --   --   --   HGB 7.2* 6.4* 6.4* 7.1* 9.8* 9.8*  HCT 22.0* 19.7* 19.7* 20.9* 28.4* 29.0*  MCV 105.3* 103.1* 103.1* 96.3 94.7 93.9  PLT 304 261 252 210 234 251   Basic Metabolic Panel: Recent Labs  Lab 09/21/23 1536 09/21/23 1843 09/22/23 0219 09/22/23 0459  NA 129*  --  133*  --   K 4.0  --  4.1  --   CL 100  --  104  --   CO2 15*  --  20*  --   GLUCOSE 189*  --  124*  --  BUN 16  --  17  --   CREATININE 1.32*  --  1.18  --   CALCIUM  8.0*  --  7.8*  --   MG 2.5*  --   --   --   PHOS  --  2.2*  --  2.4*   Liver Function Tests: Recent Labs  Lab 09/21/23 1536  AST 22  ALT 23  ALKPHOS 58  BILITOT 0.5  PROT 6.6  ALBUMIN 3.7   CBG: Recent Labs  Lab 09/22/23 0728 09/22/23 1143 09/22/23 1720 09/22/23 2052 09/23/23 0759  GLUCAP 111* 116* 182* 118* 124*    Discharge time spent: greater than 30 minutes.  This record has been created using Conservation officer, historic buildings. Errors have been sought and corrected,but may not always be located. Such creation errors do not reflect on the standard of care.   Signed: Amaryllis Dare, MD [facility] 09/23/2023

## 2023-09-25 ENCOUNTER — Encounter: Payer: Self-pay | Admitting: Internal Medicine

## 2023-09-25 LAB — SURGICAL PATHOLOGY

## 2023-09-29 ENCOUNTER — Ambulatory Visit: Payer: Medicare Other | Admitting: Family Medicine

## 2023-09-29 VITALS — BP 180/66 | HR 83 | Resp 20 | Ht 67.0 in | Wt 162.0 lb

## 2023-09-29 DIAGNOSIS — K922 Gastrointestinal hemorrhage, unspecified: Secondary | ICD-10-CM

## 2023-09-29 DIAGNOSIS — I10 Essential (primary) hypertension: Secondary | ICD-10-CM

## 2023-09-29 MED ORDER — VALSARTAN 40 MG PO TABS: 40.0000 mg | ORAL_TABLET | Freq: Every day | ORAL | 2 refills | Status: DC

## 2023-09-29 NOTE — Progress Notes (Signed)
      Established patient visit   Patient: Omar Watson   DOB: 24-Jan-1956   68 y.o. Male  MRN: [id number] Visit Date: 09/29/2023  Today's healthcare provider: Nancyann Perry, MD   Chief Complaint  Patient presents with   Hospitalization Follow-up   Subjective    Discussed the use of AI scribe software for clinical note transcription with the patient, who gave verbal consent to proceed.  History of Present Illness   Patient with a history of hypertension and diabetes, was recently hospitalized due to upper GI bleed. The patient reported not taking prescribed blood pressure medication for the past three years, which he believes may have contributed to the current health issue. He did not observe any blood in his stool, but reported cognitive impairment, which led to hospital admission. During the hospital stay, he had an endoscopy revealing a polyp in the stomach andwo units of blood transfusion, which seemed to improve his condition.  Upon discharge, the patient resumed his blood pressure medication, amlodipine , along with rosuvastatin  and metformin  for his diabetes. He was also started on esomeprazole  which he is taking consistetnly Despite daily adherence to the blood pressure medication, the patient's blood pressure remains elevated.       Medications: Outpatient Medications Prior to Visit  Medication Sig   amLODipine  (NORVASC ) 10 MG tablet Take 1 tablet (10 mg total) by mouth daily.   esomeprazole  (NEXIUM ) 40 MG capsule Take 1 capsule (40 mg total) by mouth 2 (two) times daily before a meal.   Fe Fum-Vit C-Vit B12-FA (TRIGELS-F FORTE) CAPS capsule Take 1 capsule by mouth 2 (two) times daily.   gabapentin  (NEURONTIN ) 100 MG capsule Take 1 capsule (100 mg total) by mouth at bedtime.   metFORMIN  (GLUCOPHAGE -XR) 500 MG 24 hr tablet Take 1 tablet (500 mg total) by mouth 2 (two) times daily with a meal.   Multiple Vitamin (MULTIVITAMIN WITH MINERALS) TABS tablet Take 1 tablet by mouth  daily.   rosuvastatin  (CRESTOR ) 10 MG tablet Take 1 tablet (10 mg total) by mouth daily.   No facility-administered medications prior to visit.   Review of Systems     Objective    BP (!) 180/66   Pulse 83   Resp 20   Ht 5' 7 (1.702 m)   Wt 162 lb (73.5 kg)   SpO2 100%   BMI 25.37 kg/m    Physical Exam   CHEST: Wheeze upon auscultation.      Assessment & Plan       Gastrointestinal Bleed Recent hospitalization for GI bleed with transfusion of 2 units of blood. No visible blood in stool. Gastritis and polyp found on endoscopy. -Continue esomeprazole  for gastritis. -Complete colonoscopy with Dr. Aundria on February 20th, 2025 at 10:15 AM anticipating lower endscopy  Hypertension Elevated blood pressure despite daily use of amlodipine . -Add valsartan  40mg  daily. -Continue amlodipine  5  Diabetes Currently on metformin . -Continue metformin .  Hyperlipidemia Currently on rosuvastatin . -Continue rosuvastatin .  Smoking Audible wheezing on exam. -Encourage smoking cessation.  General Health Maintenance -Check complete blood count today to assess for ongoing blood loss. -Follow-up appointment to be scheduled after lab results are received.    No follow-ups on file.      Nancyann Perry, MD  [facility] [phone] (phone) [phone] (fax)  [facility]

## 2023-09-30 LAB — CBC
Hematocrit: 32.2 % — ABNORMAL LOW (ref 37.5–51.0)
Hemoglobin: 10.7 g/dL — ABNORMAL LOW (ref 13.0–17.7)
MCH: 32.7 pg (ref 26.6–33.0)
MCHC: 33.2 g/dL (ref 31.5–35.7)
MCV: 99 fL — ABNORMAL HIGH (ref 79–97)
Platelets: 272 10*3/uL (ref 150–450)
RBC: 3.27 x10E6/uL — ABNORMAL LOW (ref 4.14–5.80)
RDW: 13.2 % (ref 11.6–15.4)
WBC: 7 10*3/uL (ref 3.4–10.8)

## 2023-09-30 LAB — COMPREHENSIVE METABOLIC PANEL
ALT: 14 [IU]/L (ref 0–44)
AST: 15 [IU]/L (ref 0–40)
Albumin: 3.9 g/dL (ref 3.9–4.9)
Alkaline Phosphatase: 71 [IU]/L (ref 44–121)
BUN/Creatinine Ratio: 12 (ref 10–24)
BUN: 13 mg/dL (ref 8–27)
Bilirubin Total: <0.2 mg/dL (ref 0.0–1.2)
CO2: 23 mmol/L (ref 20–29)
Calcium: 8.7 mg/dL (ref 8.6–10.2)
Chloride: 100 mmol/L (ref 96–106)
Creatinine, Ser: 1.1 mg/dL (ref 0.76–1.27)
Globulin, Total: 2.3 g/dL (ref 1.5–4.5)
Glucose: 146 mg/dL — ABNORMAL HIGH (ref 70–99)
Potassium: 4.6 mmol/L (ref 3.5–5.2)
Sodium: 137 mmol/L (ref 134–144)
Total Protein: 6.2 g/dL (ref 6.0–8.5)
eGFR: 74 mL/min/{1.73_m2} (ref >59)

## 2023-10-06 NOTE — Addendum Note (Signed)
Addended by: Malva Limes on: 10/06/2023 08:14 AM   Modules accepted: Level of Service

## 2023-12-25 ENCOUNTER — Other Ambulatory Visit: Payer: Self-pay | Admitting: Family Medicine

## 2024-02-15 ENCOUNTER — Other Ambulatory Visit: Payer: Self-pay | Admitting: Family Medicine

## 2024-02-15 DIAGNOSIS — I10 Essential (primary) hypertension: Secondary | ICD-10-CM

## 2024-02-15 NOTE — Telephone Encounter (Unsigned)
 Copied from CRM [id number]. Topic: Clinical - Medication Refill >> Feb 15, 2024  8:16 AM Fonda T wrote: Medication:  amLODipine  (NORVASC ) 10 MG tablet  Has the patient contacted their pharmacy? Yes (Agent: If no, request that the patient contact the pharmacy for the refill. If patient does not wish to contact the pharmacy document the reason why and proceed with request.) (Agent: If yes, when and what did the pharmacy advise?)  This is the patient's preferred pharmacy:   [facility] [address] [place] - [address] [place] [address] Phone: [phone] Fax: [phone]  Is this the correct pharmacy for this prescription? Yes If no, delete pharmacy and type the correct one.   Has the prescription been filled recently? Yes  Is the patient out of the medication? Yes  Has the patient been seen for an appointment in the last year OR does the patient have an upcoming appointment? Yes  Can we respond through MyChart? No, patient prefers phone call  Agent: Please be advised that Rx refills may take up to 3 business days. We ask that you follow-up with your pharmacy.

## 2024-02-16 MED ORDER — AMLODIPINE BESYLATE 10 MG PO TABS: 10.0000 mg | ORAL_TABLET | Freq: Every day | ORAL | 2 refills | Status: DC

## 2024-02-16 NOTE — Telephone Encounter (Signed)
 Patient will need an office visit for additional refills. Requested Prescriptions  Pending Prescriptions Disp Refills   amLODipine  (NORVASC ) 10 MG tablet 30 tablet 2    Sig: Take 1 tablet (10 mg total) by mouth daily.     Cardiovascular: Calcium  Channel Blockers 2 Failed - 02/16/2024  5:24 PM      Failed - Last BP in normal range    BP Readings from Last 1 Encounters:  09/29/23 (!) 180/66         Failed - Valid encounter within last 6 months    Recent Outpatient Visits   None            Passed - Last Heart Rate in normal range    Pulse Readings from Last 1 Encounters:  09/29/23 83

## 2024-05-16 ENCOUNTER — Other Ambulatory Visit: Payer: Self-pay | Admitting: Family Medicine

## 2024-05-16 DIAGNOSIS — I10 Essential (primary) hypertension: Secondary | ICD-10-CM

## 2024-07-08 ENCOUNTER — Other Ambulatory Visit: Payer: Self-pay | Admitting: Family Medicine

## 2024-07-08 DIAGNOSIS — I10 Essential (primary) hypertension: Secondary | ICD-10-CM
